# Patient Record
Sex: Female | Born: 1990 | Hispanic: Yes | Marital: Married | Smoking: Never smoker
Health system: Southern US, Community
[De-identification: ages and names within clinical notes are randomized; demographics above are authoritative.]

## PROBLEM LIST (undated history)

## (undated) ENCOUNTER — Inpatient Hospital Stay: Payer: Self-pay

## (undated) DIAGNOSIS — D649 Anemia, unspecified: Secondary | ICD-10-CM

---

## 2016-03-02 ENCOUNTER — Encounter: Payer: Self-pay | Admitting: *Deleted

## 2016-03-02 ENCOUNTER — Emergency Department
Admission: EM | Admit: 2016-03-02 | Discharge: 2016-03-02 | Disposition: A | Discharge status: Home / Self Care | Payer: Medicaid Other | Attending: Emergency Medicine | Admitting: Emergency Medicine

## 2016-03-02 ENCOUNTER — Emergency Department: Payer: Medicaid Other

## 2016-03-02 DIAGNOSIS — B9689 Other specified bacterial agents as the cause of diseases classified elsewhere: Secondary | ICD-10-CM

## 2016-03-02 DIAGNOSIS — O23591 Infection of other part of genital tract in pregnancy, first trimester: Secondary | ICD-10-CM | POA: Insufficient documentation

## 2016-03-02 DIAGNOSIS — O99611 Diseases of the digestive system complicating pregnancy, first trimester: Secondary | ICD-10-CM | POA: Insufficient documentation

## 2016-03-02 DIAGNOSIS — Z3A08 8 weeks gestation of pregnancy: Secondary | ICD-10-CM | POA: Diagnosis not present

## 2016-03-02 DIAGNOSIS — K219 Gastro-esophageal reflux disease without esophagitis: Secondary | ICD-10-CM

## 2016-03-02 DIAGNOSIS — N760 Acute vaginitis: Secondary | ICD-10-CM

## 2016-03-02 DIAGNOSIS — N898 Other specified noninflammatory disorders of vagina: Secondary | ICD-10-CM | POA: Insufficient documentation

## 2016-03-02 DIAGNOSIS — O26891 Other specified pregnancy related conditions, first trimester: Secondary | ICD-10-CM | POA: Diagnosis present

## 2016-03-02 DIAGNOSIS — R1013 Epigastric pain: Secondary | ICD-10-CM

## 2016-03-02 LAB — URINALYSIS, COMPLETE (UACMP) WITH MICROSCOPIC
BILIRUBIN URINE: NEGATIVE
Bacteria, UA: NONE SEEN
GLUCOSE, UA: NEGATIVE mg/dL
Hgb urine dipstick: NEGATIVE
KETONES UR: 80 mg/dL — AB
LEUKOCYTES UA: NEGATIVE
Nitrite: NEGATIVE
PH: 5 (ref 5.0–8.0)
Protein, ur: NEGATIVE mg/dL
RBC / HPF: NONE SEEN RBC/hpf (ref 0–5)
SPECIFIC GRAVITY, URINE: 1.026 (ref 1.005–1.030)

## 2016-03-02 LAB — COMPREHENSIVE METABOLIC PANEL
ALK PHOS: 64 U/L (ref 38–126)
ALT: 25 U/L (ref 14–54)
AST: 20 U/L (ref 15–41)
Albumin: 4.1 g/dL (ref 3.5–5.0)
Anion gap: 8 (ref 5–15)
BILIRUBIN TOTAL: 0.5 mg/dL (ref 0.3–1.2)
BUN: 8 mg/dL (ref 6–20)
CALCIUM: 9.7 mg/dL (ref 8.9–10.3)
CO2: 24 mmol/L (ref 22–32)
CREATININE: 0.54 mg/dL (ref 0.44–1.00)
Chloride: 102 mmol/L (ref 101–111)
GFR calc non Af Amer: >60 mL/min (ref >60)
Glucose, Bld: 84 mg/dL (ref 65–99)
Potassium: 3.4 mmol/L — ABNORMAL LOW (ref 3.5–5.1)
Sodium: 134 mmol/L — ABNORMAL LOW (ref 135–145)
TOTAL PROTEIN: 7.9 g/dL (ref 6.5–8.1)

## 2016-03-02 LAB — HCG, QUANTITATIVE, PREGNANCY: hCG, Beta Chain, Quant, S: 248378 m[IU]/mL — ABNORMAL HIGH (ref <5)

## 2016-03-02 LAB — LIPASE, BLOOD: LIPASE: 40 U/L (ref 11–51)

## 2016-03-02 LAB — CHLAMYDIA/NGC RT PCR (ARMC ONLY)
Chlamydia Tr: NOT DETECTED
N GONORRHOEAE: NOT DETECTED

## 2016-03-02 LAB — CBC
HCT: 40.8 % (ref 35.0–47.0)
Hemoglobin: 13.7 g/dL (ref 12.0–16.0)
MCH: 29.2 pg (ref 26.0–34.0)
MCHC: 33.6 g/dL (ref 32.0–36.0)
MCV: 87 fL (ref 80.0–100.0)
PLATELETS: 349 10*3/uL (ref 150–440)
RBC: 4.69 MIL/uL (ref 3.80–5.20)
RDW: 15 % — AB (ref 11.5–14.5)
WBC: 9.1 10*3/uL (ref 3.6–11.0)

## 2016-03-02 LAB — WET PREP, GENITAL
Sperm: NONE SEEN
TRICH WET PREP: NONE SEEN
YEAST WET PREP: NONE SEEN

## 2016-03-02 MED ORDER — METOCLOPRAMIDE HCL 5 MG/ML IJ SOLN
INTRAMUSCULAR | Status: AC
  Administered 2016-03-02: 10 mg via INTRAVENOUS
  Filled 2016-03-02: qty 2

## 2016-03-02 MED ORDER — ALUM & MAG HYDROXIDE-SIMETH 200-200-20 MG/5ML PO SUSP
30.0000 mL | Freq: Once | ORAL | Status: AC
  Administered 2016-03-02: 30 mL via ORAL
  Filled 2016-03-02: qty 30

## 2016-03-02 MED ORDER — METOCLOPRAMIDE HCL 5 MG/ML IJ SOLN
10.0000 mg | Freq: Once | INTRAMUSCULAR | Status: AC
  Administered 2016-03-02: 10 mg via INTRAVENOUS

## 2016-03-02 MED ORDER — METRONIDAZOLE 500 MG PO TABS: 500.0000 mg | ORAL_TABLET | Freq: Two times a day (BID) | ORAL | 0 refills | Status: AC

## 2016-03-02 MED ORDER — METOCLOPRAMIDE HCL 10 MG PO TABS: 10.0000 mg | ORAL_TABLET | Freq: Three times a day (TID) | ORAL | 1 refills | Status: DC

## 2016-03-02 MED ORDER — ALUM & MAG HYDROXIDE-SIMETH 400-400-40 MG/5ML PO SUSP: 5.0000 mL | Freq: Four times a day (QID) | ORAL | 0 refills | Status: DC | PRN

## 2016-03-02 MED ORDER — FAMOTIDINE IN NACL 20-0.9 MG/50ML-% IV SOLN
INTRAVENOUS | Status: AC
  Administered 2016-03-02: 20 mg via INTRAVENOUS
  Filled 2016-03-02: qty 50

## 2016-03-02 MED ORDER — FAMOTIDINE IN NACL 20-0.9 MG/50ML-% IV SOLN
20.0000 mg | Freq: Once | INTRAVENOUS | Status: AC
  Administered 2016-03-02: 20 mg via INTRAVENOUS

## 2016-03-02 MED ORDER — FAMOTIDINE 20 MG PO TABS: 20.0000 mg | ORAL_TABLET | Freq: Every day | ORAL | 1 refills | Status: DC

## 2016-03-02 NOTE — ED Triage Notes (Signed)
Pt reports she is approx 2 months pregnant.  Pt [facility] for [facility].  Pt has abd pain with vag discharge and foul odor.  Interpreter with pt.

## 2016-03-02 NOTE — ED Provider Notes (Signed)
[facility] Emergency Department Provider Note  ____________________________________________  Time seen: Approximately 8:10 PM  I have reviewed the triage vital signs and the nursing notes.   HISTORY  Chief Complaint Abdominal Pain   HPI Savannah Terry is a 26 y.o. female [phone] [id number] at [redacted] weeks GA per LMP who presents for evaluation of abdominal pain and vaginal discharge. Patient reports one week of constant epigastric burning pain worse post-prandially. Taking TUMS at home with no relief. No prior h/o GERD. No NSAIDS, melena, coffee ground emesis or hematemesis. Patient has had persistent nausea and vomiting with this pregnancy. No fever, no chest pain, no SOB, no lower abdominal pain, no dysuria or hematuria. She also complains of 3 days of white vaginal discharge and foul odor. She has not had a confirmatory US for this pregnancy yet.  No past medical history on file.  There are no active problems to display for this patient.   No past surgical history on file.  Prior to Admission medications   Medication Sig Start Date End Date Taking? Authorizing Provider  alum & mag hydroxide-simeth (MAALOX MAX) 400-400-40 MG/5ML suspension Take 5 mLs by mouth every 6 (six) hours as needed for indigestion. 03/02/16   Nita Sicklearolina Antoinette Haskett, MD  famotidine (PEPCID) 20 MG tablet Take 1 tablet (20 mg total) by mouth daily. 03/02/16 03/02/17  Nita Sicklearolina Masaki Rothbauer, MD  metoCLOPramide (REGLAN) 10 MG tablet Take 1 tablet (10 mg total) by mouth 3 (three) times daily with meals. 03/02/16 03/02/17  Nita Sicklearolina Tyvon Eggenberger, MD  metroNIDAZOLE (FLAGYL) 500 MG tablet Take 1 tablet (500 mg total) by mouth 2 (two) times daily. 03/02/16 03/09/16  Nita Sicklearolina Caterin Tabares, MD    Allergies Patient has no known allergies.  No family history on file.  Social History Social History  Substance Use Topics  . Smoking status: Never Smoker  . Smokeless tobacco: Never Used  . Alcohol use No    Review of  Systems  Constitutional: Negative for fever. Eyes: Negative for visual changes. ENT: Negative for sore throat. Neck: No neck pain  Cardiovascular: Negative for chest pain. Respiratory: Negative for shortness of breath. Gastrointestinal: + epigastric burning abdominal pain, nausea, and vomiting. No diarrhea. Genitourinary: Negative for dysuria. + vaginal discharge Musculoskeletal: Negative for back pain. Skin: Negative for rash. Neurological: Negative for headaches, weakness or numbness. Psych: No SI or HI  ____________________________________________   PHYSICAL EXAM:  VITAL SIGNS: ED Triage Vitals  Enc Vitals Group     BP 03/02/16 1912 (!) 153/93     Pulse --      Resp 03/02/16 1912 20     Temp 03/02/16 1912 98.4 F (36.9 C)     Temp Source 03/02/16 1912 Oral     SpO2 03/02/16 1912 99 %     Weight 03/02/16 1914 130 lb (59 kg)     Height 03/02/16 1914 5\' 3"  (1.6 m)     Head Circumference --      Peak Flow --      Pain Score 03/02/16 1914 7     Pain Loc --      Pain Edu? --      Excl. in GC? --     Constitutional: Alert and oriented. Well appearing and in no apparent distress. HEENT:      Head: Normocephalic and atraumatic.         Eyes: Conjunctivae are normal. Sclera is non-icteric. EOMI. PERRL      Mouth/Throat: Mucous membranes are moist.  Neck: Supple with no signs of meningismus. Cardiovascular: Regular rate and rhythm. No murmurs, gallops, or rubs. 2+ symmetrical distal pulses are present in all extremities. No JVD. Respiratory: Normal respiratory effort. Lungs are clear to auscultation bilaterally. No wheezes, crackles, or rhonchi.  Gastrointestinal: Soft, ttp over the epigastric region, and non distended with positive bowel sounds. No rebound or guarding. Genitourinary: No CVA tenderness. Musculoskeletal: Nontender with normal range of motion in all extremities. No edema, cyanosis, or erythema of extremities. Neurologic: Normal speech and language. Face  is symmetric. Moving all extremities. No gross focal neurologic deficits are appreciated. Skin: Skin is warm, dry and intact. No rash noted. Psychiatric: Mood and affect are normal. Speech and behavior are normal.  ____________________________________________   LABS (all labs ordered are listed, but only abnormal results are displayed)  Labs Reviewed  WET PREP, GENITAL - Abnormal; Notable for the following:       Result Value   Clue Cells Wet Prep HPF POC PRESENT (*)    WBC, Wet Prep HPF POC FEW (*)    All other components within normal limits  COMPREHENSIVE METABOLIC PANEL - Abnormal; Notable for the following:    Sodium 134 (*)    Potassium 3.4 (*)    All other components within normal limits  CBC - Abnormal; Notable for the following:    RDW 15.0 (*)    All other components within normal limits  URINALYSIS, COMPLETE (UACMP) WITH MICROSCOPIC - Abnormal; Notable for the following:    Color, Urine YELLOW (*)    APPearance HAZY (*)    Ketones, ur 80 (*)    Squamous Epithelial / LPF 6-30 (*)    All other components within normal limits  HCG, QUANTITATIVE, PREGNANCY - Abnormal; Notable for the following:    hCG, Beta Chain, Quant, [place] 161,096 (*)    All other components within normal limits  CHLAMYDIA/NGC RT PCR ([facility] ONLY)  LIPASE, BLOOD   ____________________________________________  EKG  ED ECG REPORT I, Nita Sickle, the attending physician, personally viewed and interpreted this ECG.  Normal sinus rhythm, rate of 81, normal intervals, normal axis, no ST elevations or depressions. Normal EKG.  ____________________________________________  RADIOLOGY  TVUS: Single intrauterine pregnancy. Estimated gestational age by crown-rump length is 8 weeks 3 days. No acute complication demonstrated sonographically.  RUQ [place]: Cholelithiasis without sonographic findings of acute cholecystitis ____________________________________________   PROCEDURES  Procedure(s)  performed: None Procedures Critical Care performed:  None ____________________________________________   INITIAL IMPRESSION / ASSESSMENT AND PLAN / ED COURSE  26 y.o. female [phone] at [redacted] weeks GA per LMP who presents for evaluation of 1 weeks of epigastric burning abdominal pain worse after eating and few days of foul odor vaginal discharge. Patient has no RUQ ttp and normal LFT, Tbili, and lipase making GB pathology less likely but will perform RUQ [place] as she is tender over her epigastrium region. Description of pain consistent with GERD vs gastritis. No coffee ground emesis or melena concerning for bleeding PUD. Will give maalox, IV pepcid, and IV reglan. Patient has no lower abdominal ttp however has not had a formal TVUS, will perform one. Will also do pelvic and get wet prep/gc/chlamydia.     Care transferred to Dr. Huel Cote at the end of my shift pending RUQ [place] and TVUS results.Patient remains stable.  Pertinent labs & imaging results that were available during my care of the patient were reviewed by me and considered in my medical decision making (see chart for details).  ____________________________________________   FINAL CLINICAL IMPRESSION(S) / ED DIAGNOSES  Final diagnoses:  Epigastric abdominal pain  Gastroesophageal reflux disease without esophagitis  BV (bacterial vaginosis)      NEW MEDICATIONS STARTED DURING THIS VISIT:  Discharge Medication List as of 03/02/2016 10:53 PM    START taking these medications   Details  alum & mag hydroxide-simeth (MAALOX MAX) 400-400-40 MG/5ML suspension Take 5 mLs by mouth every 6 (six) hours as needed for indigestion., Starting Wed 03/02/2016, Print    famotidine (PEPCID) 20 MG tablet Take 1 tablet (20 mg total) by mouth daily., Starting Wed 03/02/2016, Until Thu 03/02/2017, Print    metoCLOPramide (REGLAN) 10 MG tablet Take 1 tablet (10 mg total) by mouth 3 (three) times daily with meals., Starting Wed 03/02/2016, Until Thu  03/02/2017, Print    metroNIDAZOLE (FLAGYL) 500 MG tablet Take 1 tablet (500 mg total) by mouth 2 (two) times daily., Starting Wed 03/02/2016, Until Wed 03/09/2016, Print         Note:  This document was prepared using Dragon voice recognition software and may include unintentional dictation errors.    Nita Sickle, MD 03/05/16 [phone]

## 2016-03-02 NOTE — ED Notes (Signed)
Pt returned from US

## 2016-04-02 ENCOUNTER — Emergency Department: Payer: Medicaid Other

## 2016-04-02 ENCOUNTER — Emergency Department
Admission: EM | Admit: 2016-04-02 | Discharge: 2016-04-02 | Disposition: A | Discharge status: Home / Self Care | Payer: Medicaid Other | Attending: Emergency Medicine | Admitting: Emergency Medicine

## 2016-04-02 ENCOUNTER — Encounter: Payer: Self-pay | Admitting: Emergency Medicine

## 2016-04-02 DIAGNOSIS — O0281 Inappropriate change in quantitative human chorionic gonadotropin (hCG) in early pregnancy: Secondary | ICD-10-CM | POA: Diagnosis not present

## 2016-04-02 DIAGNOSIS — O2341 Unspecified infection of urinary tract in pregnancy, first trimester: Secondary | ICD-10-CM | POA: Insufficient documentation

## 2016-04-02 DIAGNOSIS — O210 Mild hyperemesis gravidarum: Secondary | ICD-10-CM | POA: Insufficient documentation

## 2016-04-02 DIAGNOSIS — Z79899 Other long term (current) drug therapy: Secondary | ICD-10-CM | POA: Diagnosis not present

## 2016-04-02 DIAGNOSIS — Z3A13 13 weeks gestation of pregnancy: Secondary | ICD-10-CM | POA: Diagnosis not present

## 2016-04-02 DIAGNOSIS — O219 Vomiting of pregnancy, unspecified: Secondary | ICD-10-CM | POA: Diagnosis present

## 2016-04-02 LAB — URINALYSIS, COMPLETE (UACMP) WITH MICROSCOPIC
BACTERIA UA: NONE SEEN
Bilirubin Urine: NEGATIVE
Glucose, UA: NEGATIVE mg/dL
Hgb urine dipstick: NEGATIVE
Ketones, ur: 80 mg/dL — AB
Nitrite: NEGATIVE
PROTEIN: 100 mg/dL — AB
SPECIFIC GRAVITY, URINE: 1.026 (ref 1.005–1.030)
pH: 6 (ref 5.0–8.0)

## 2016-04-02 LAB — COMPREHENSIVE METABOLIC PANEL
ALK PHOS: 51 U/L (ref 38–126)
ALT: 69 U/L — ABNORMAL HIGH (ref 14–54)
ANION GAP: 11 (ref 5–15)
AST: 36 U/L (ref 15–41)
Albumin: 3.9 g/dL (ref 3.5–5.0)
BILIRUBIN TOTAL: 0.7 mg/dL (ref 0.3–1.2)
BUN: 7 mg/dL (ref 6–20)
CALCIUM: 9.6 mg/dL (ref 8.9–10.3)
CO2: 20 mmol/L — ABNORMAL LOW (ref 22–32)
CREATININE: 0.47 mg/dL (ref 0.44–1.00)
Chloride: 101 mmol/L (ref 101–111)
GFR calc non Af Amer: >60 mL/min (ref >60)
Glucose, Bld: 84 mg/dL (ref 65–99)
Potassium: 2.7 mmol/L — CL (ref 3.5–5.1)
Sodium: 132 mmol/L — ABNORMAL LOW (ref 135–145)
TOTAL PROTEIN: 7.9 g/dL (ref 6.5–8.1)

## 2016-04-02 LAB — HCG, QUANTITATIVE, PREGNANCY: HCG, BETA CHAIN, QUANT, S: 180592 m[IU]/mL — AB (ref <5)

## 2016-04-02 LAB — CBC
HEMATOCRIT: 42.4 % (ref 35.0–47.0)
HEMOGLOBIN: 14.9 g/dL (ref 12.0–16.0)
MCH: 29.9 pg (ref 26.0–34.0)
MCHC: 35.1 g/dL (ref 32.0–36.0)
MCV: 85.2 fL (ref 80.0–100.0)
Platelets: 375 10*3/uL (ref 150–440)
RBC: 4.98 MIL/uL (ref 3.80–5.20)
RDW: 14.5 % (ref 11.5–14.5)
WBC: 8.8 10*3/uL (ref 3.6–11.0)

## 2016-04-02 LAB — LIPASE, BLOOD: Lipase: 19 U/L (ref 11–51)

## 2016-04-02 MED ORDER — SODIUM CHLORIDE 0.9 % IV SOLN
Freq: Once | INTRAVENOUS | Status: AC
Start: 2016-04-02 — End: 2016-04-02
  Administered 2016-04-02: 14:00:00 via INTRAVENOUS

## 2016-04-02 MED ORDER — POTASSIUM CHLORIDE CRYS ER 20 MEQ PO TBCR
60.0000 meq | EXTENDED_RELEASE_TABLET | Freq: Once | ORAL | Status: AC
  Administered 2016-04-02: 60 meq via ORAL
  Filled 2016-04-02: qty 3

## 2016-04-02 MED ORDER — ACETAMINOPHEN 500 MG PO TABS
1000.0000 mg | ORAL_TABLET | Freq: Once | ORAL | Status: AC
  Administered 2016-04-02: 1000 mg via ORAL
  Filled 2016-04-02: qty 2

## 2016-04-02 MED ORDER — ONDANSETRON HCL 4 MG/2ML IJ SOLN
4.0000 mg | Freq: Once | INTRAMUSCULAR | Status: AC
  Administered 2016-04-02: 4 mg via INTRAVENOUS
  Filled 2016-04-02: qty 2

## 2016-04-02 MED ORDER — NITROFURANTOIN MONOHYD MACRO 100 MG PO CAPS: 100.0000 mg | ORAL_CAPSULE | Freq: Two times a day (BID) | ORAL | 0 refills | Status: DC

## 2016-04-02 MED ORDER — ONDANSETRON 4 MG PO TBDP: 4.0000 mg | ORAL_TABLET | Freq: Three times a day (TID) | ORAL | 1 refills | Status: DC | PRN

## 2016-04-02 NOTE — ED Triage Notes (Addendum)
Pt to ed with c/o abd pain x 4 days. Also reports vomiting after eating.  Pt is approx 3 months pregnant. Was seen here in feb for vomiting.

## 2016-04-02 NOTE — ED Notes (Signed)
Pt able to tolerate other half tablet of potassium and tylenol. Offered more nausea medication, pt declines at this time.

## 2016-04-02 NOTE — ED Notes (Signed)
Pt resting comfortably at this time, decline medication for nausea.

## 2016-04-02 NOTE — ED Provider Notes (Signed)
[facility] Emergency Department Provider Note        Time seen: ----------------------------------------- 1:55 PM on 04/02/2016 -----------------------------------------    I have reviewed the triage vital signs and the nursing notes.   HISTORY  Chief Complaint Abdominal Pain    HPI Savannah Terry is a 26 y.o. female who presents to ER for abdominal pain for the last 4 days. Patient reports vomiting after eating. She is approximately 3 months pregnant, was seen here in February for vomiting.Patient denies any vaginal bleeding or discharge. She reports taking Reglan as an outpatient without any improvement.   History reviewed. No pertinent past medical history.  There are no active problems to display for this patient.   History reviewed. No pertinent surgical history.  Allergies Patient has no known allergies.  Social History Social History  Substance Use Topics  . Smoking status: Never Smoker  . Smokeless tobacco: Never Used  . Alcohol use No    Review of Systems Constitutional: Negative for fever. Cardiovascular: Negative for chest pain. Respiratory: Negative for shortness of breath. Gastrointestinal: Positive for abdominal pain, vomiting Genitourinary: Negative for dysuria. negative for vaginal bleeding Musculoskeletal: Negative for back pain. Skin: Negative for rash. Neurological: Negative for headaches, focal weakness or numbness.  10-point ROS otherwise negative.  ____________________________________________   PHYSICAL EXAM:  VITAL SIGNS: ED Triage Vitals  Enc Vitals Group     BP 04/02/16 1300 (!) 121/93     Pulse Rate 04/02/16 1300 (!) 123     Resp 04/02/16 1300 18     Temp 04/02/16 1300 98.3 F (36.8 C)     Temp Source 04/02/16 1300 Oral     SpO2 04/02/16 1300 96 %     Weight 04/02/16 1300 130 lb (59 kg)     Height --      Head Circumference --      Peak Flow --      Pain Score 04/02/16 1301 8   Pain Loc --      Pain Edu? --      Excl. in GC? --     Constitutional: Alert and oriented. Well appearing and in no distress. Eyes: Conjunctivae are normal. PERRL. Normal extraocular movements. ENT   Head: Normocephalic and atraumatic.   Nose: No congestion/rhinnorhea.   Mouth/Throat: Mucous membranes are moist.   Neck: No stridor. Cardiovascular: Normal rate, regular rhythm. No murmurs, rubs, or gallops. Respiratory: Normal respiratory effort without tachypnea nor retractions. Breath sounds are clear and equal bilaterally. No wheezes/rales/rhonchi. Gastrointestinal: Soft with nonfocal tenderness, normal bowel sounds. Musculoskeletal: Nontender with normal range of motion in all extremities. No lower extremity tenderness nor edema. Neurologic:  Normal speech and language. No gross focal neurologic deficits are appreciated.  Skin:  Skin is warm, dry and intact. No rash noted. Psychiatric: Mood and affect are normal. Speech and behavior are normal.  ____________________________________________  ED COURSE:  Pertinent labs & imaging results that were available during my care of the patient were reviewed by me and considered in my medical decision making (see chart for details). Patient presents to ER with abdominal pain and persistent vomiting. We will assess with labs, give IV fluids and antiemetics.   Procedures ____________________________________________   LABS (pertinent positives/negatives)  Labs Reviewed  COMPREHENSIVE METABOLIC PANEL - Abnormal; Notable for the following:       Result Value   Sodium 132 (*)    Potassium 2.7 (*)    CO2 20 (*)    ALT 69 (*)  All other components within normal limits  URINALYSIS, COMPLETE (UACMP) WITH MICROSCOPIC - Abnormal; Notable for the following:    Color, Urine AMBER (*)    APPearance HAZY (*)    Ketones, ur 80 (*)    Protein, ur 100 (*)    Leukocytes, UA TRACE (*)    Squamous Epithelial / LPF 6-30 (*)    All other  components within normal limits  HCG, QUANTITATIVE, PREGNANCY - Abnormal; Notable for the following:    hCG, Beta Chain, Quant, S 180,592 (*)    All other components within normal limits  URINE CULTURE  LIPASE, BLOOD  CBC    RADIOLOGY Images were viewed by me  Pregnancy ultrasound  IMPRESSION: Single live IUP with estimated gestational age [redacted] weeks 0 days. ____________________________________________  FINAL ASSESSMENT AND PLAN  Hyperemesis, UTI  Plan: Patient with labs and imaging as dictated above. Patient presented to the ER for abdominal pain and vomiting in pregnancy. Ultrasound findings were reassuring, she's calmly feeling better. I will discharge her with Zofran prescriptions and Macrobid. She is stable for outpatient follow-up with her OB/GYN doctor.   Emily Filbert, MD   Note: This note was generated in part or whole with voice recognition software. Voice recognition is usually quite accurate but there are transcription errors that can and very often do occur. I apologize for any typographical errors that were not detected and corrected.     Emily Filbert, MD 04/02/16 [phone]

## 2016-04-02 NOTE — ED Notes (Signed)
Pt only able to tolerate 2.35 potassium tablets. Pt dry heaving.

## 2016-04-04 LAB — URINE CULTURE

## 2016-05-17 LAB — OB RESULTS CONSOLE GC/CHLAMYDIA
Chlamydia: NEGATIVE
Gonorrhea: NEGATIVE

## 2016-05-17 LAB — OB RESULTS CONSOLE RPR: RPR: NONREACTIVE

## 2016-05-17 LAB — OB RESULTS CONSOLE HEPATITIS B SURFACE ANTIGEN: Hepatitis B Surface Ag: NEGATIVE

## 2016-05-17 LAB — OB RESULTS CONSOLE ABO/RH

## 2016-05-17 LAB — OB RESULTS CONSOLE VARICELLA ZOSTER ANTIBODY, IGG: Varicella: IMMUNE

## 2016-05-17 LAB — OB RESULTS CONSOLE HIV ANTIBODY (ROUTINE TESTING): HIV: NONREACTIVE

## 2016-05-17 LAB — OB RESULTS CONSOLE RUBELLA ANTIBODY, IGM: RUBELLA: IMMUNE

## 2016-06-10 ENCOUNTER — Observation Stay
Admission: EM | Admit: 2016-06-10 | Discharge: 2016-06-10 | Disposition: A | Discharge status: Home / Self Care | Payer: Medicaid Other | Attending: Obstetrics and Gynecology | Admitting: Obstetrics and Gynecology

## 2016-06-10 DIAGNOSIS — M545 Low back pain, unspecified: Secondary | ICD-10-CM | POA: Diagnosis present

## 2016-06-10 DIAGNOSIS — Z3A22 22 weeks gestation of pregnancy: Secondary | ICD-10-CM | POA: Diagnosis not present

## 2016-06-10 DIAGNOSIS — O26892 Other specified pregnancy related conditions, second trimester: Secondary | ICD-10-CM | POA: Diagnosis not present

## 2016-06-10 DIAGNOSIS — O26893 Other specified pregnancy related conditions, third trimester: Secondary | ICD-10-CM | POA: Diagnosis not present

## 2016-06-10 LAB — URINALYSIS, COMPLETE (UACMP) WITH MICROSCOPIC
Bilirubin Urine: NEGATIVE
Glucose, UA: NEGATIVE mg/dL
Hgb urine dipstick: NEGATIVE
KETONES UR: NEGATIVE mg/dL
Leukocytes, UA: NEGATIVE
Nitrite: NEGATIVE
PH: 7 (ref 5.0–8.0)
Protein, ur: NEGATIVE mg/dL
RBC / HPF: NONE SEEN RBC/hpf (ref 0–5)
Specific Gravity, Urine: 1.005 (ref 1.005–1.030)
WBC, UA: NONE SEEN WBC/hpf (ref 0–5)

## 2016-06-10 MED ORDER — ACETAMINOPHEN 325 MG PO TABS: 650.0000 mg | ORAL_TABLET | ORAL | Status: DC | PRN

## 2016-06-10 NOTE — OB Triage Note (Addendum)
Patient arrived in triage with c/o abdominal and back pain that started about 45 mins ago.  Pain was constant initially, but is now intermittent "pressure" in upper abdomen. Patient states it does not feel like contractions. Denies leaking of fluid or vaginal bleeding. States positive fetal movement. Abdomen palpates soft, tender to touch in upper abdomen. EFM explained and applied.

## 2016-06-10 NOTE — Final Progress Note (Signed)
Physician Final Progress Note  Patient ID: Savannah Terry MRN: [id number] DOB/AGE: 26/05/1990 25 y.o.  Admit date: 06/10/2016 Admitting provider: Vena AustriaAndreas Jazell Rosenau, MD Discharge date: 06/10/2016   Admission Diagnoses: Lumbago  Discharge Diagnoses:  Active Problems:   Lumbago  26 yo [id number] at 6428w5d presenting with lumbago, improved after admission, no contraction on tocometer, negative UA.  +FM, no LOF, no VB  Consults: None  Significant Findings/ Diagnostic Studies:  Results for orders placed or performed during the hospital encounter of 06/10/16 (from the past 24 hour(s))  Urinalysis, Complete w Microscopic     Status: Abnormal   Collection Time: 06/10/16  3:52 AM  Result Value Ref Range   Color, Urine STRAW (A) YELLOW   APPearance CLEAR (A) CLEAR   Specific Gravity, Urine 1.005 1.005 - 1.030   pH 7.0 5.0 - 8.0   Glucose, UA NEGATIVE NEGATIVE mg/dL   Hgb urine dipstick NEGATIVE NEGATIVE   Bilirubin Urine NEGATIVE NEGATIVE   Ketones, ur NEGATIVE NEGATIVE mg/dL   Protein, ur NEGATIVE NEGATIVE mg/dL   Nitrite NEGATIVE NEGATIVE   Leukocytes, UA NEGATIVE NEGATIVE   RBC / HPF NONE SEEN 0 - 5 RBC/hpf   WBC, UA NONE SEEN 0 - 5 WBC/hpf   Bacteria, UA RARE (A) NONE SEEN   Squamous Epithelial / LPF 0-5 (A) NONE SEEN   Mucous PRESENT      Procedures: +FHT on admission  Discharge Condition: good  Disposition: 01-Home or Self Care  Diet: Regular diet  Discharge Activity: Activity as tolerated  Discharge Instructions    Discharge activity:  No Restrictions    Complete by:  As directed    Discharge diet:  No restrictions    Complete by:  As directed    No sexual activity restrictions    Complete by:  As directed    Notify physician for a general feeling that "something is not right"    Complete by:  As directed    Notify physician for increase or change in vaginal discharge    Complete by:  As directed    Notify physician for intestinal cramps, with or without  diarrhea, sometimes described as "gas pain"    Complete by:  As directed    Notify physician for leaking of fluid    Complete by:  As directed    Notify physician for low, dull backache, unrelieved by heat or Tylenol    Complete by:  As directed    Notify physician for menstrual like cramps    Complete by:  As directed    Notify physician for pelvic pressure    Complete by:  As directed    Notify physician for uterine contractions.  These may be painless and feel like the uterus is tightening or the baby is  "balling up"    Complete by:  As directed    Notify physician for vaginal bleeding    Complete by:  As directed    PRETERM LABOR:  Includes any of the follwing symptoms that occur between 20 - [redacted] weeks gestation.  If these symptoms are not stopped, preterm labor can result in preterm delivery, placing your baby at risk    Complete by:  As directed      Allergies as of 06/10/2016   No Known Allergies     Medication List    TAKE these medications   alum & mag hydroxide-simeth 400-400-40 MG/5ML suspension Commonly known as:  MAALOX MAX Take 5 mLs by mouth every 6 (six)  hours as needed for indigestion.   doxylamine (Sleep) 25 MG tablet Commonly known as:  UNISOM Take 25 mg by mouth 2 (two) times daily.   famotidine 20 MG tablet Commonly known as:  PEPCID Take 1 tablet (20 mg total) by mouth daily.   metoCLOPramide 10 MG tablet Commonly known as:  REGLAN Take 1 tablet (10 mg total) by mouth 3 (three) times daily with meals.   nitrofurantoin (macrocrystal-monohydrate) 100 MG capsule Commonly known as:  MACROBID Take 1 capsule (100 mg total) by mouth 2 (two) times daily.   ondansetron 4 MG disintegrating tablet Commonly known as:  ZOFRAN ODT Take 1 tablet (4 mg total) by mouth every 8 (eight) hours as needed for nausea or vomiting.   PRENATAL PO Take 1 tablet by mouth daily.        Total time spent taking care of this patient: 15 minutes  Signed: Vena Austria 06/10/2016, 5:02 AM

## 2016-06-10 NOTE — OB Triage Note (Signed)
Patient and spouse given discharge instructions including preterm labor precautions, fetal kick count instructions, and follow up information- verbalized understanding.  All questions answered. Patient discharged in stable condition accompanied by husband.

## 2016-06-10 NOTE — Discharge Summary (Signed)
See final progress note. 

## 2016-09-26 ENCOUNTER — Encounter: Payer: Self-pay | Admitting: *Deleted

## 2016-09-26 ENCOUNTER — Observation Stay
Admission: EM | Admit: 2016-09-26 | Discharge: 2016-09-26 | Disposition: A | Discharge status: Home / Self Care | Payer: Medicaid Other | Attending: Obstetrics and Gynecology | Admitting: Obstetrics and Gynecology

## 2016-09-26 DIAGNOSIS — O368130 Decreased fetal movements, third trimester, not applicable or unspecified: Secondary | ICD-10-CM | POA: Diagnosis present

## 2016-09-26 DIAGNOSIS — Z3A38 38 weeks gestation of pregnancy: Secondary | ICD-10-CM | POA: Insufficient documentation

## 2016-09-26 DIAGNOSIS — Z79899 Other long term (current) drug therapy: Secondary | ICD-10-CM | POA: Diagnosis not present

## 2016-09-26 DIAGNOSIS — O4290 Premature rupture of membranes, unspecified as to length of time between rupture and onset of labor, unspecified weeks of gestation: Secondary | ICD-10-CM | POA: Insufficient documentation

## 2016-09-26 HISTORY — DX: Anemia, unspecified: D64.9

## 2016-09-26 LAB — URINALYSIS, ROUTINE W REFLEX MICROSCOPIC
BACTERIA UA: NONE SEEN
BILIRUBIN URINE: NEGATIVE
Glucose, UA: NEGATIVE mg/dL
Ketones, ur: NEGATIVE mg/dL
Nitrite: NEGATIVE
PROTEIN: NEGATIVE mg/dL
SPECIFIC GRAVITY, URINE: 1.012 (ref 1.005–1.030)
pH: 7 (ref 5.0–8.0)

## 2016-09-26 NOTE — OB Triage Note (Signed)
C/O back and abdominal pain since 1800 last night. C/O leaking fluid the entire pregnancy as she did with her other two children. Nitrazine negative. Cervix closed upon vaginal exam. C/O decreased fetal movement "last night". Fetal movement audible while pt. Is on monitor. Elaina HoopsElks, Alinda Egolf S

## 2016-09-26 NOTE — Discharge Summary (Addendum)
Schermerhorn, Ihor Austin, MD  Obstetrics    Hide copied text Hover for attribution information Patient ID: Savannah Terry, female   DOB: 1990-11-07, 26 y.o.   MRN: [id number]  Savannah Terry is a 26 y.o. female. She is at [redacted]w[redacted]d gestation. Patient's last menstrual period was 01/03/2016 (exact date). Estimated Date of Delivery: 10/09/16  Prenatal care site:  [facility]  Chief complaint: abd tightening , lbp , decreased fetal movt( 1 week)  , bladder pressure also leakage of fluid for the past 30 days .  [facility] and she scheduled for a repeat [facility] at [facility] on 10/04/16.  Denies fever , vag bleeding .  States that she had low fluid in both prior pregnancies      Maternal Medical History:       Past Medical History:  Diagnosis Date  . Anemia   cervical dysplasia  H/o PTL in prior preg .       Past Surgical History:  Procedure Laterality Date  . CESAREAN SECTION     x2    No Known Allergies         Prior to Admission medications   Medication Sig Start Date End Date Taking? Authorizing Provider  Prenatal Vit-Fe Fumarate-FA (PRENATAL PO) Take 1 tablet by mouth daily.   Yes [provider]  alum & mag hydroxide-simeth (MAALOX MAX) 400-400-40 MG/5ML suspension Take 5 mLs by mouth every 6 (six) hours as needed for indigestion. Patient not taking: Reported on 09/26/2016 03/02/16   Nita Sickle, MD  doxylamine, Sleep, (UNISOM) 25 MG tablet Take 25 mg by mouth 2 (two) times daily.    [provider]  famotidine (PEPCID) 20 MG tablet Take 1 tablet (20 mg total) by mouth daily. Patient not taking: Reported on 09/26/2016 03/02/16 03/02/17  Nita Sickle, MD  metoCLOPramide (REGLAN) 10 MG tablet Take 1 tablet (10 mg total) by mouth 3 (three) times daily with meals. Patient not taking: Reported on 09/26/2016 03/02/16 03/02/17  Nita Sickle, MD  nitrofurantoin, macrocrystal-monohydrate, (MACROBID) 100  MG capsule Take 1 capsule (100 mg total) by mouth 2 (two) times daily. Patient not taking: Reported on 09/26/2016 04/02/16   Emily Filbert, MD  ondansetron (ZOFRAN ODT) 4 MG disintegrating tablet Take 1 tablet (4 mg total) by mouth every 8 (eight) hours as needed for nausea or vomiting. Patient not taking: Reported on 09/26/2016 04/02/16   Emily Filbert, MD     Social History: She  reports that she has never smoked. She has never used smokeless tobacco. She reports that she does not drink alcohol or use drugs.  Family History:.no history of gyn cancers  Review of Systems: A full review of systems was performed and negative except as noted in the HPI.   General : no weight loss Eyes : no vision change  Ears : no loss of hearing  Endocrine : no thyroid d/o  Pulmonary : no asthma, no sob CV : no chest pain ,no htn GI: no hepatitis, no Constipation  GU: no recent UTI . No STD M/S: no muscle weakness  O:  BP 127/81   Pulse 100   Temp 98.8 F (37.1 C) (Oral)   Resp 18   Ht  (1.575 m)   Wt 71.2 kg (157 lb)   LMP 01/03/2016 (Exact Date)   BMI 28.72 kg/m  No results found for this or any previous visit (from the past 48 hour(s)).   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 464w1d here for antenatal surveillance forLOF  And decreased fetal movt  No evidence of PTL , LOF   Reassuring fetal  monitoring by NST   Adequate AFI by u/s today   Fetal Wellbeing: Reassuring Cat 1 tracing.  Reactive NST   D/c home stable, precautions reviewed, follow-up as scheduled.  Instructions given for fetal kick counts ----- Ihor Austinhomas J Schermerhorn MD  Attending Obstetrician and Gynecologist [facility], Department of OB/GYN [facility]     Electronically signed by Schermerhorn, Ihor Austinhomas J, MD at 09/26/2016 3:01 PM      ED to Hosp-Admission (Current) on 09/26/2016

## 2016-09-26 NOTE — Progress Notes (Addendum)
Patient ID: Tai Syfert, female   DOB: Nov 08, 1990, 26 y.o.   MRN: [id number]  Adelise Buswell Tienna Bienkowski is a 25 y.o. female. She is at [redacted]w[redacted]d gestation. Patient's last menstrual period was 01/03/2016 (exact date). Estimated Date of Delivery: 10/09/16  Prenatal care site:  [facility]  Chief complaint: abd tightening , lbp , decreased fetal movt( 1 week)  , bladder pressure also leakage of fluid for the past 30 days .  [facility] following and she scheduled for a repeat LTCS at [facility] on 10/04/16.  Denies fever , vag bleeding .  States that she had low fluid in both prior pregnancies      Maternal Medical History:   Past Medical History:  Diagnosis Date  . Anemia   cervical dysplasia  H/o PTL in prior preg .  Past Surgical History:  Procedure Laterality Date  . CESAREAN SECTION     x2    No Known Allergies  Prior to Admission medications   Medication Sig Start Date End Date Taking? Authorizing Provider  Prenatal Vit-Fe Fumarate-FA (PRENATAL PO) Take 1 tablet by mouth daily.   Yes [provider]  alum & mag hydroxide-simeth (MAALOX MAX) 400-400-40 MG/5ML suspension Take 5 mLs by mouth every 6 (six) hours as needed for indigestion. Patient not taking: Reported on 09/26/2016 03/02/16   Nita Sickle, MD  doxylamine, Sleep, (UNISOM) 25 MG tablet Take 25 mg by mouth 2 (two) times daily.    [provider]  famotidine (PEPCID) 20 MG tablet Take 1 tablet (20 mg total) by mouth daily. Patient not taking: Reported on 09/26/2016 03/02/16 03/02/17  Nita Sickle, MD  metoCLOPramide (REGLAN) 10 MG tablet Take 1 tablet (10 mg total) by mouth 3 (three) times daily with meals. Patient not taking: Reported on 09/26/2016 03/02/16 03/02/17  Nita Sickle, MD  nitrofurantoin, macrocrystal-monohydrate, (MACROBID) 100 MG capsule Take 1 capsule (100 mg total) by mouth 2 (two) times daily. Patient not taking: Reported on 09/26/2016 04/02/16   Emily Filbert, MD   ondansetron (ZOFRAN ODT) 4 MG disintegrating tablet Take 1 tablet (4 mg total) by mouth every 8 (eight) hours as needed for nausea or vomiting. Patient not taking: Reported on 09/26/2016 04/02/16   Emily Filbert, MD     Social History: She  reports that she has never smoked. She has never used smokeless tobacco. She reports that she does not drink alcohol or use drugs.  Family History:.no history of gyn cancers  Review of Systems: A full review of systems was performed and negative except as noted in the HPI.   General : no weight loss Eyes : no vision change  Ears : no loss of hearing  Endocrine : no thyroid d/o  Pulmonary : no asthma, no sob CV : no chest pain ,no htn GI: no hepatitis, no Constipation  GU: no recent UTI . No STD M/S: no muscle weakness  O:  BP 127/81   Pulse 100   Temp 98.8 F (37.1 C) (Oral)   Resp 18   Ht  (1.575 m)   Wt 71.2 kg (157 lb)   LMP 01/03/2016 (Exact Date)   BMI 28.72 kg/m  No results found for this or any previous visit (from the past 48 hour(s)).   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 424w1d here for antenatal surveillance forLOF  And decreased fetal movt  No evidence of PTL , LOF   Reassuring fetal monitoring by NST   Adequate AFI by u/s today   Fetal Wellbeing: Reassuring Cat 1 tracing.  Reactive NST   D/c home stable, precautions reviewed, follow-up as scheduled.  Instructions given for fetal kick counts ----- Ihor Austinhomas J Rhyan Radler MD   Attending Obstetrician and Gynecologist [facility], Department of OB/GYN [facility]

## 2017-01-01 ENCOUNTER — Encounter (HOSPITAL_COMMUNITY): Payer: Self-pay

## 2017-12-19 IMAGING — US US ABDOMEN LIMITED
1 series · 14 of 25 positions shown · non-contrast
Comparison: None.

CLINICAL DATA: Abdominal pain for 2 weeks. First trimester
pregnancy.

EXAM:
US ABDOMEN LIMITED - RIGHT UPPER QUADRANT

[Series 1: us abdomen limited · 0.17mm/px · 14 of 72 slices shown]
[im 1/72]
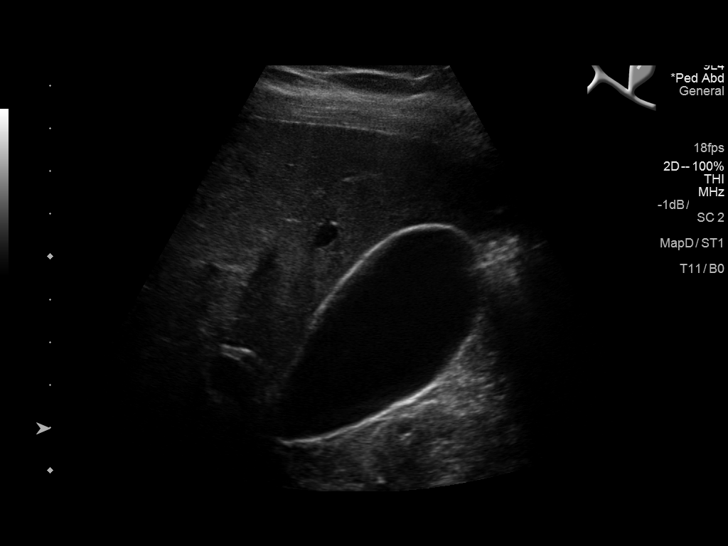
[im 6/72]
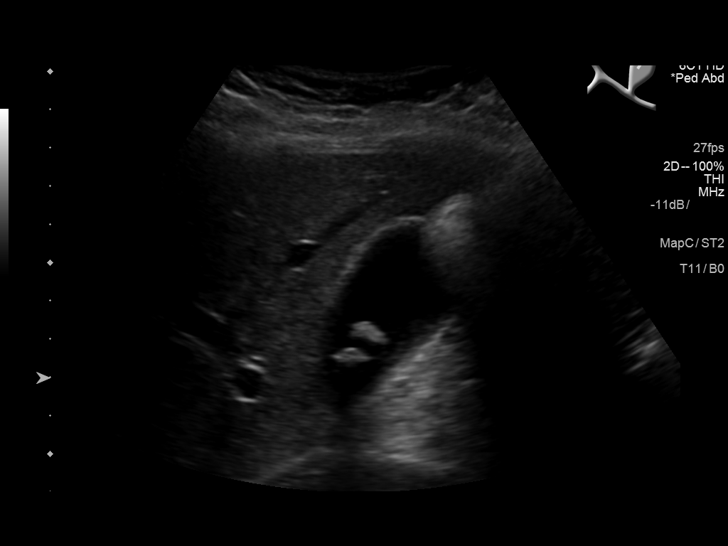
[im 12/72]
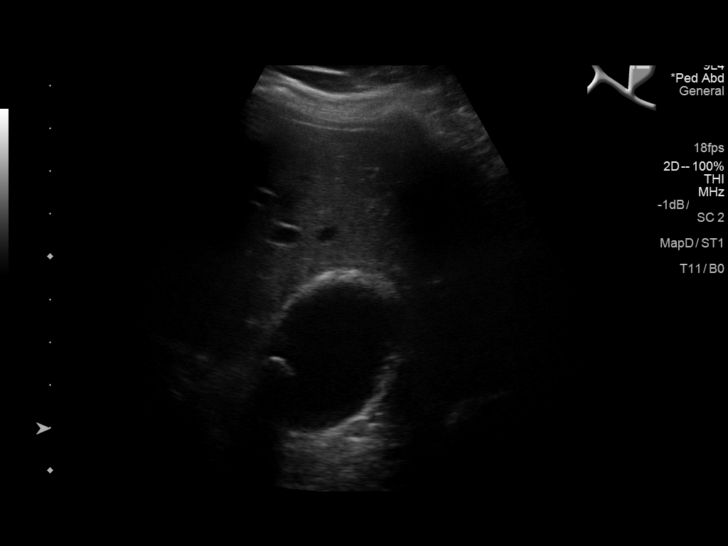
[im 18/72]
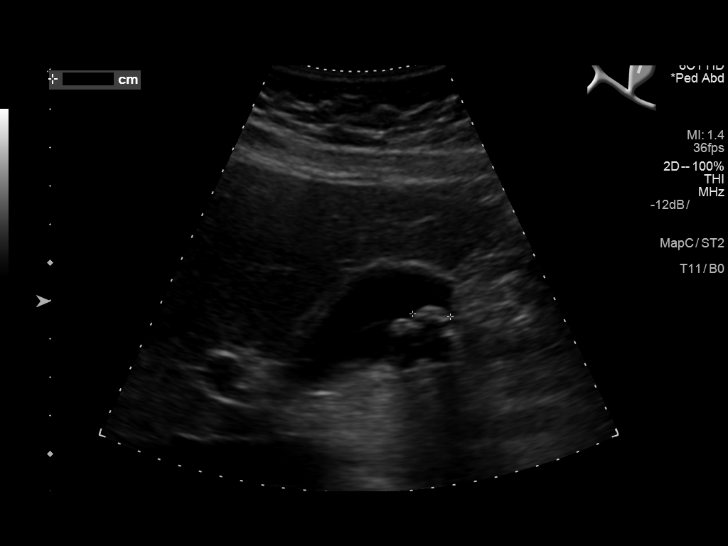
[im 24/72]
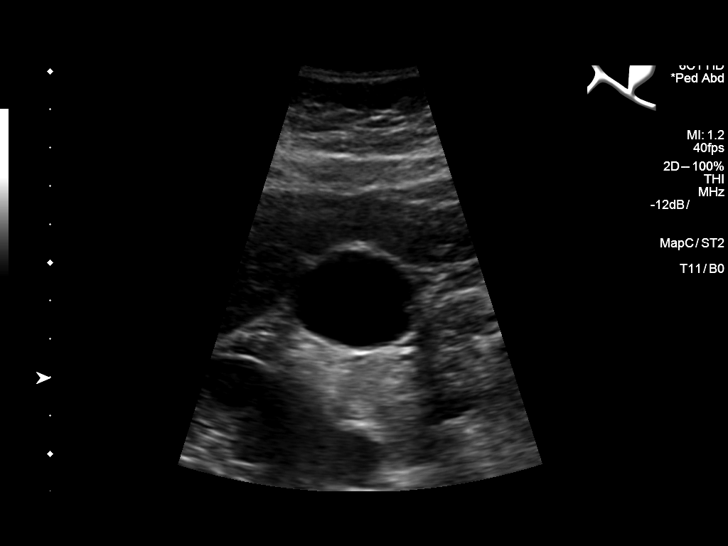
[im 27/72]
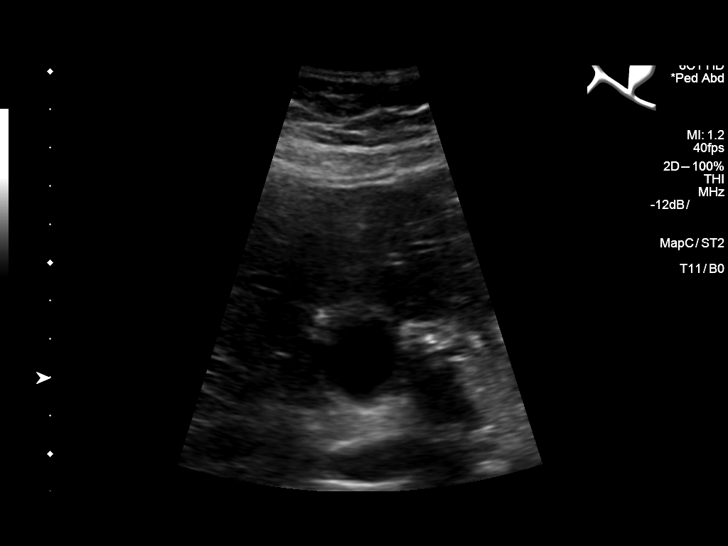
[im 33/72]
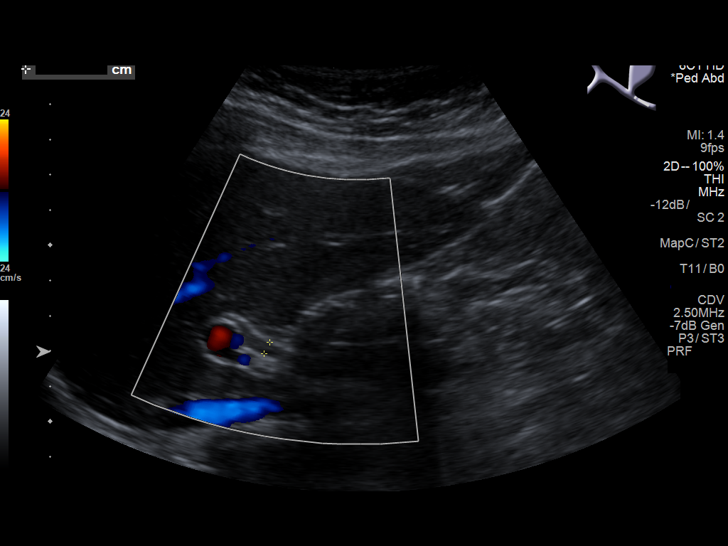
[im 39/72]
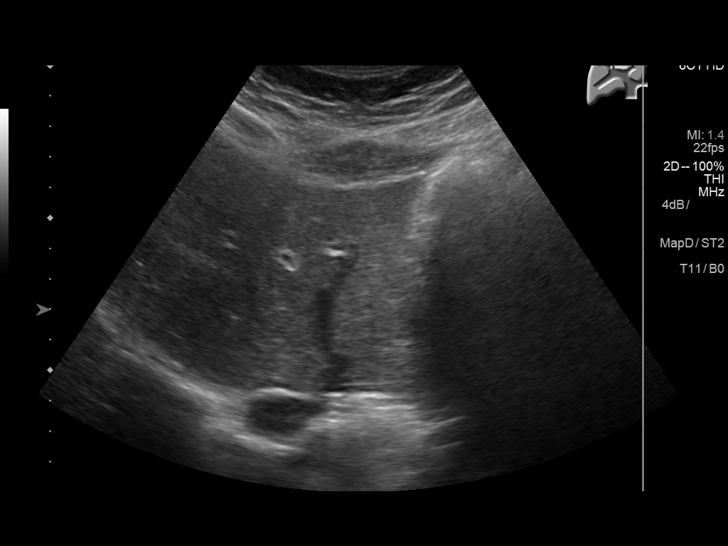
[im 45/72]
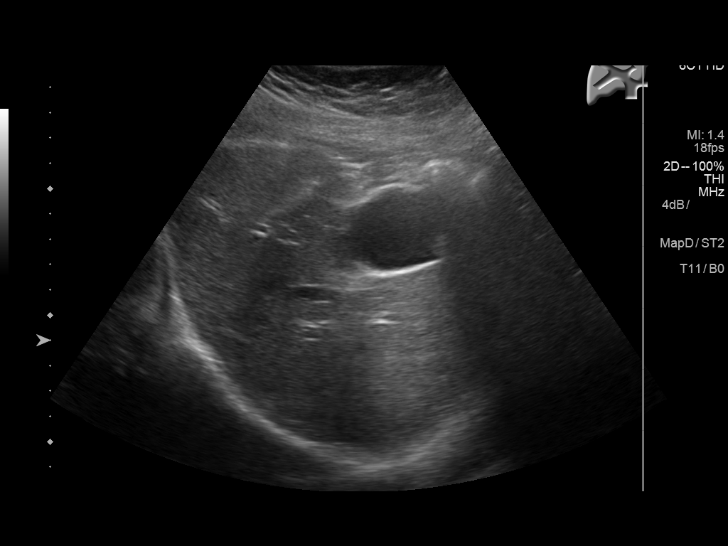
[im 48/72]
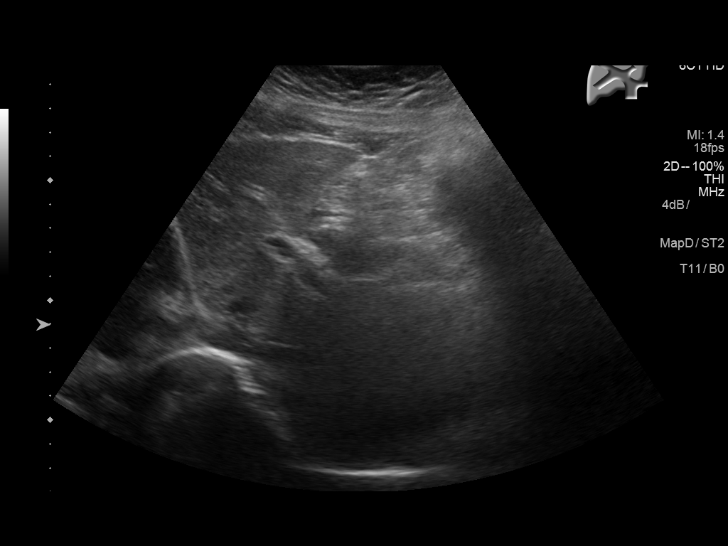
[im 54/72]
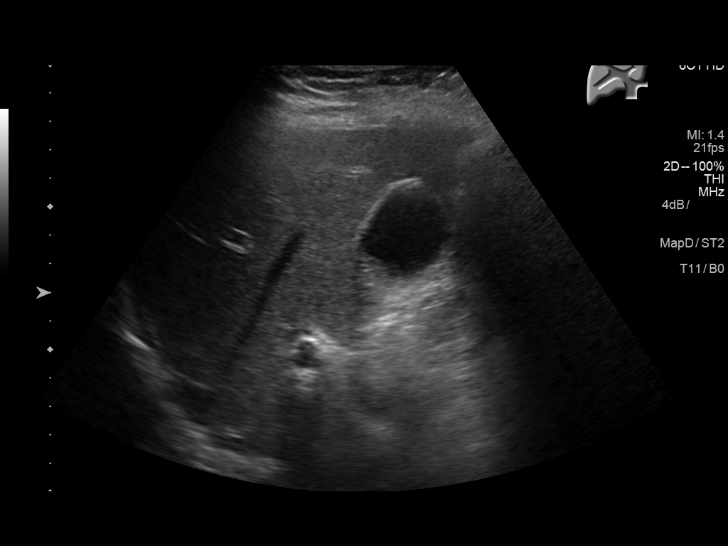
[im 60/72]
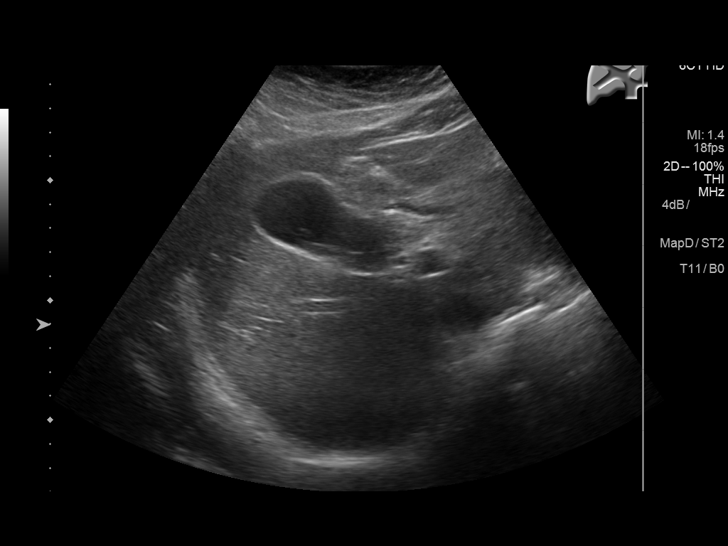
[im 66/72]
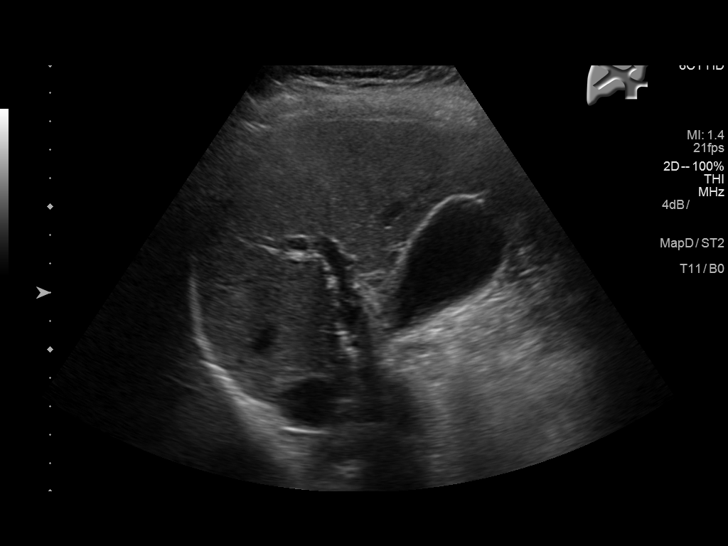
[im 72/72]
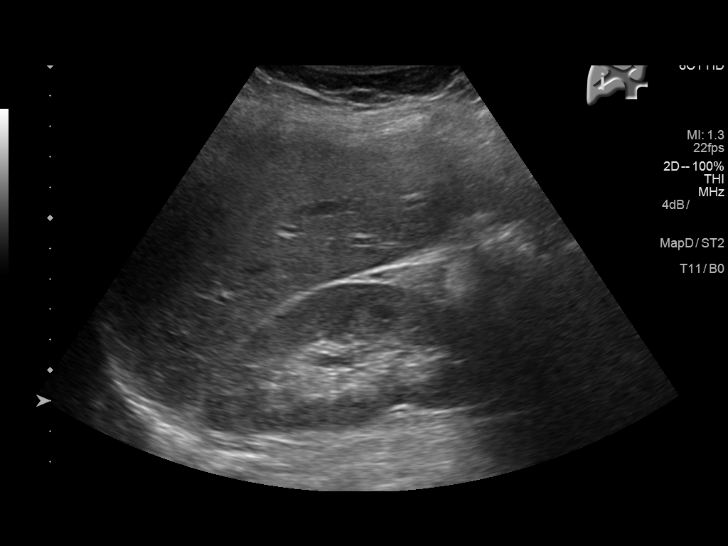

[14 of 25 positions shown; findings below may reference images not displayed]

FINDINGS: Gallbladder:

Multiple mobile echogenic gallstones measure up to 10 mm with
acoustic shadowing. No gallbladder wall thickening or
pericholecystic fluid. No sonographic Murphy's sign elicited.

Common bile duct:

Diameter: 4 mm

Liver:

No focal lesion identified. Within normal limits in parenchymal
echogenicity. Hepatopetal portal vein.
IMPRESSION: Cholelithiasis without sonographic findings of acute cholecystitis.

## 2017-12-19 IMAGING — US US OB TRANSVAGINAL
1 series · 13 of 28 positions shown · non-contrast
Comparison: None.

CLINICAL DATA: Abdominal pain and pregnancy for 2 weeks. Estimated
gestational age by LMP is 8 weeks 3 days. Quantitative beta HCG is
248,378

EXAM:
OBSTETRIC <14 WK US AND TRANSVAGINAL OB US
TECHNIQUE: Both transabdominal and transvaginal ultrasound examinations were
performed for complete evaluation of the gestation as well as the
maternal uterus, adnexal regions, and pelvic cul-de-sac.
Transvaginal technique was performed to assess early pregnancy.

[Series 1: us ob transvaginal · 0.13mm/px · 13 of 178 slices shown]
[im 7/178]
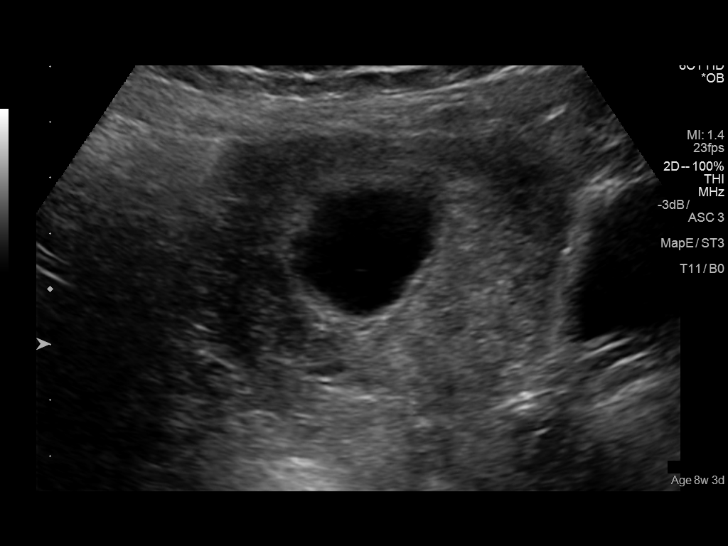
[im 20/178]
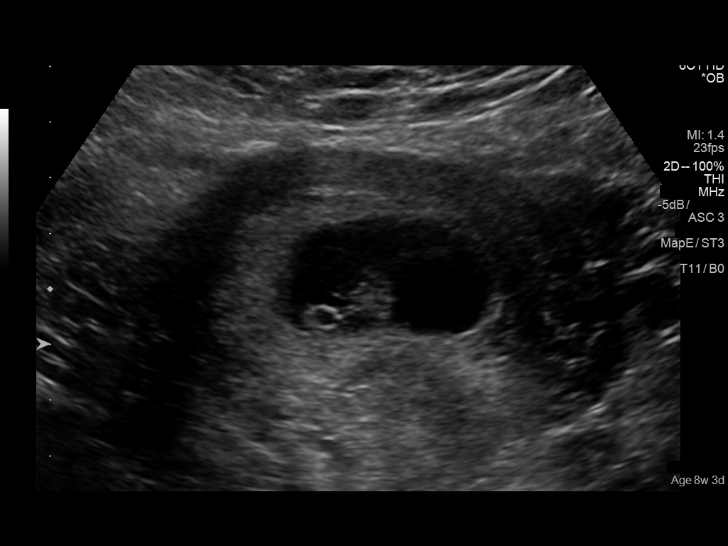
[im 33/178]
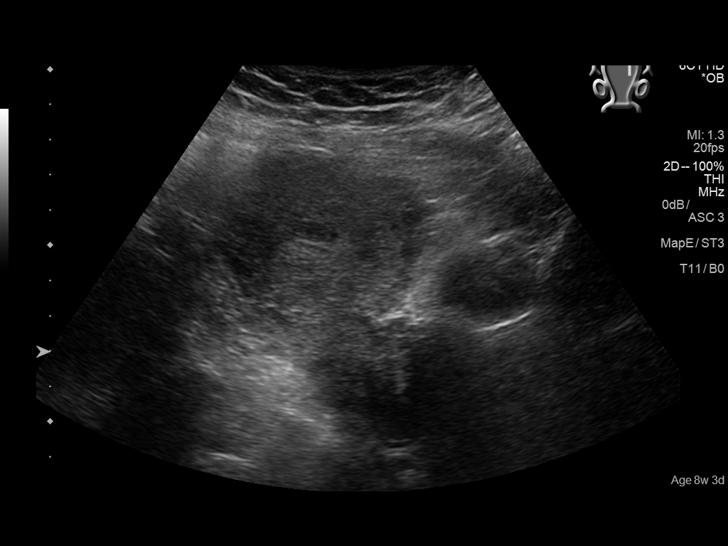
[im 46/178]
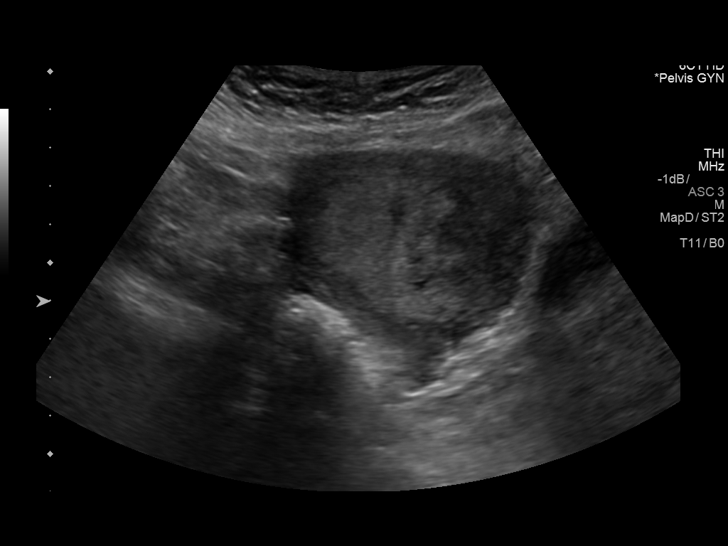
[im 60/178]
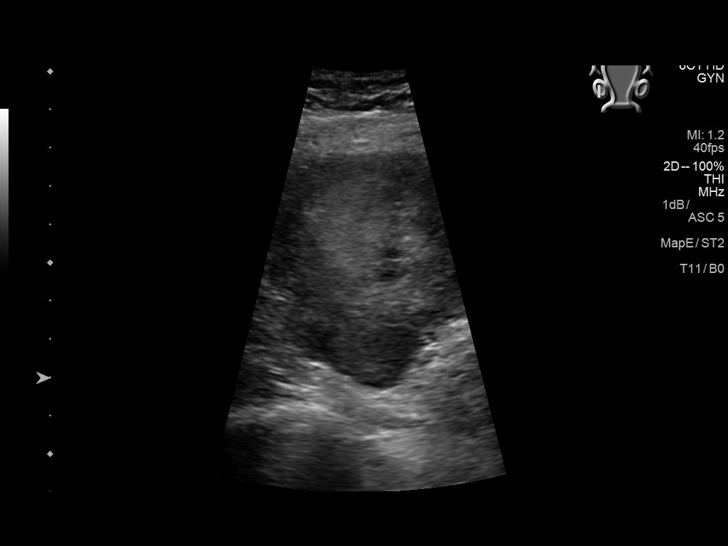
[im 73/178]
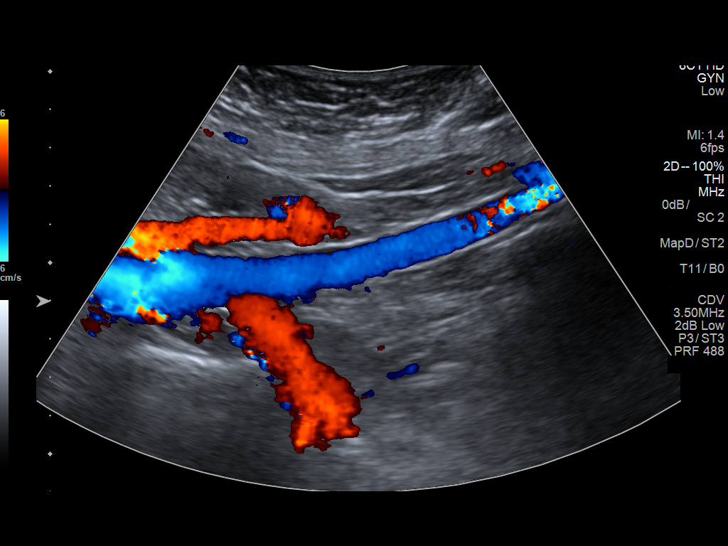
[im 92/178]
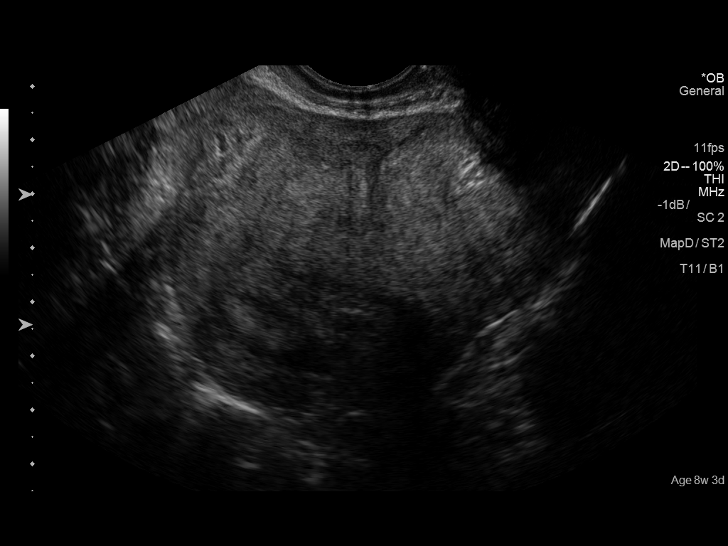
[im 105/178]
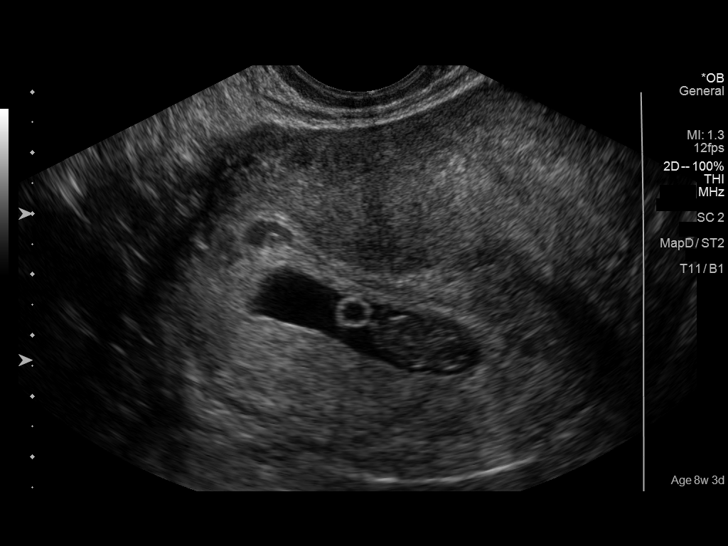
[im 119/178]
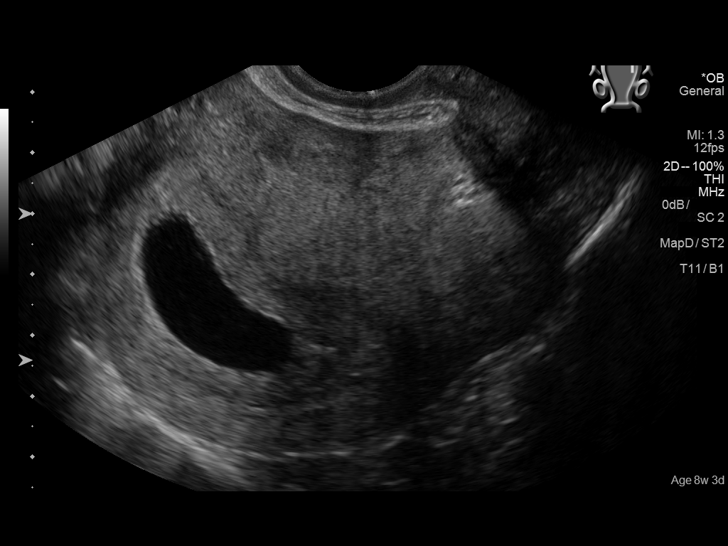
[im 132/178]
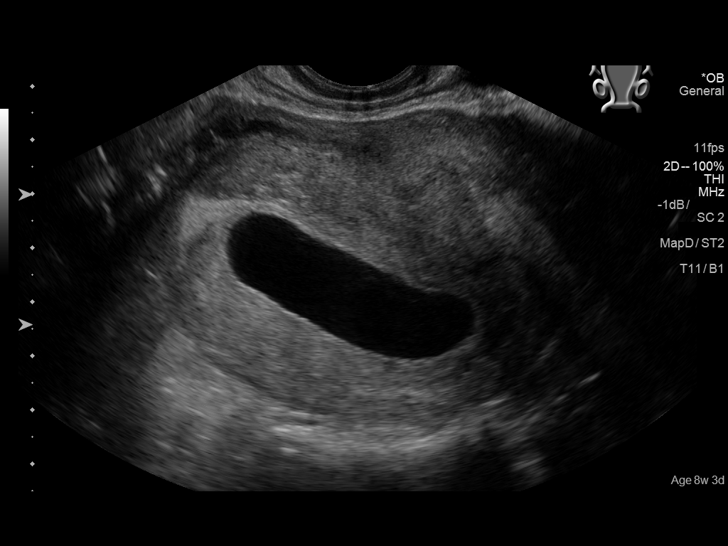
[im 145/178]
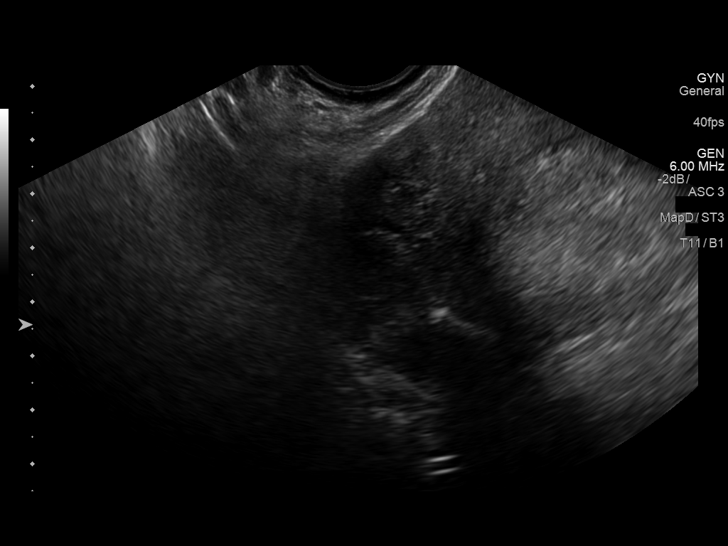
[im 158/178]
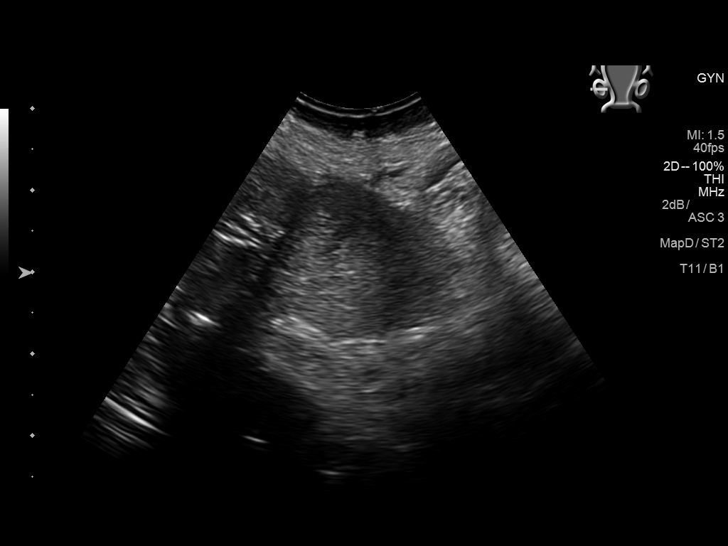
[im 171/178]
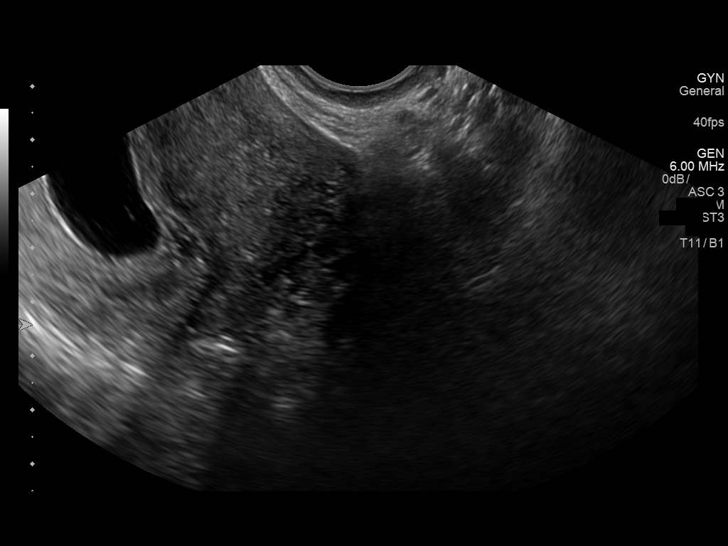

[13 of 28 positions shown; findings below may reference images not displayed]

FINDINGS: Intrauterine gestational sac: A single intrauterine gestational sac
is present.

Yolk sac:  Visualized.

Embryo:  Visualized.

Cardiac Activity: Visualized.

Heart Rate: 187  bpm

CRL:  18.9  mm   8 w   3 d                  US EDC: 10/09/2016

Subchorionic hemorrhage:  None visualized.

Maternal uterus/adnexae: Uterus is anteverted. No myometrial mass
lesions identified. The right ovary is visualized and appears
normal. Possible small corpus luteal cyst on the right. Left ovary
is not visualized due to overlying bowel. No adnexal masses seen. No
free fluid in the pelvis.
IMPRESSION: Single intrauterine pregnancy. Estimated gestational age by
crown-rump length is 8 weeks 3 days. No acute complication
demonstrated sonographically.

## 2018-07-23 ENCOUNTER — Telehealth: Payer: Self-pay | Admitting: *Deleted

## 2018-07-23 DIAGNOSIS — Z20822 Contact with and (suspected) exposure to covid-19: Secondary | ICD-10-CM

## 2018-07-23 NOTE — Telephone Encounter (Signed)
VM left for pt to CB for covid testing.  Assisted by interpreter Rodolph Bong # [phone]  Pt with cough, fever  Requested by [facility] Dept  Pts CB# [phone]

## 2018-07-23 NOTE — Telephone Encounter (Signed)
Pt scheduled for covid testing tomorrow at [facility] site.  Requested by [facility] Dept. Pt with symptoms.  Testing process reviewed, wear mask, stay in car; verbalizes understanding.  CB# [phone]

## 2018-07-24 ENCOUNTER — Other Ambulatory Visit: Payer: Medicaid Other

## 2018-07-24 DIAGNOSIS — Z20822 Contact with and (suspected) exposure to covid-19: Secondary | ICD-10-CM

## 2018-07-29 LAB — NOVEL CORONAVIRUS, NAA: SARS-CoV-2, NAA: NOT DETECTED

## 2018-08-06 ENCOUNTER — Telehealth: Payer: Self-pay | Admitting: General Practice

## 2018-08-06 NOTE — Telephone Encounter (Signed)
Pt notified by interpreter of negative results

## 2018-11-21 ENCOUNTER — Emergency Department: Payer: Self-pay

## 2018-11-21 ENCOUNTER — Encounter: Payer: Self-pay | Admitting: Emergency Medicine

## 2018-11-21 ENCOUNTER — Other Ambulatory Visit: Payer: Self-pay

## 2018-11-21 ENCOUNTER — Emergency Department
Admission: EM | Admit: 2018-11-21 | Discharge: 2018-11-21 | Disposition: A | Discharge status: Home / Self Care | Payer: Self-pay | Attending: Emergency Medicine | Admitting: Emergency Medicine

## 2018-11-21 DIAGNOSIS — K8050 Calculus of bile duct without cholangitis or cholecystitis without obstruction: Secondary | ICD-10-CM | POA: Insufficient documentation

## 2018-11-21 DIAGNOSIS — R1010 Upper abdominal pain, unspecified: Secondary | ICD-10-CM

## 2018-11-21 DIAGNOSIS — Z79899 Other long term (current) drug therapy: Secondary | ICD-10-CM | POA: Insufficient documentation

## 2018-11-21 DIAGNOSIS — D649 Anemia, unspecified: Secondary | ICD-10-CM | POA: Insufficient documentation

## 2018-11-21 LAB — CBC WITH DIFFERENTIAL/PLATELET
Abs Immature Granulocytes: 0.01 10*3/uL (ref 0.00–0.07)
Basophils Absolute: 0.1 10*3/uL (ref 0.0–0.1)
Basophils Relative: 1 %
Eosinophils Absolute: 0.1 10*3/uL (ref 0.0–0.5)
Eosinophils Relative: 2 %
HCT: 29.4 % — ABNORMAL LOW (ref 36.0–46.0)
Hemoglobin: 7.8 g/dL — ABNORMAL LOW (ref 12.0–15.0)
Immature Granulocytes: 0 %
Lymphocytes Relative: 25 %
Lymphs Abs: 1.2 10*3/uL (ref 0.7–4.0)
MCH: 17.8 pg — ABNORMAL LOW (ref 26.0–34.0)
MCHC: 26.5 g/dL — ABNORMAL LOW (ref 30.0–36.0)
MCV: 67.3 fL — ABNORMAL LOW (ref 80.0–100.0)
Monocytes Absolute: 0.4 10*3/uL (ref 0.1–1.0)
Monocytes Relative: 8 %
Neutro Abs: 3 10*3/uL (ref 1.7–7.7)
Neutrophils Relative %: 64 %
Platelets: 464 10*3/uL — ABNORMAL HIGH (ref 150–400)
RBC: 4.37 MIL/uL (ref 3.87–5.11)
RDW: 17.2 % — ABNORMAL HIGH (ref 11.5–15.5)
WBC: 4.8 10*3/uL (ref 4.0–10.5)
nRBC: 0 % (ref 0.0–0.2)

## 2018-11-21 LAB — COMPREHENSIVE METABOLIC PANEL
ALT: 18 U/L (ref 0–44)
AST: 26 U/L (ref 15–41)
Albumin: 4.1 g/dL (ref 3.5–5.0)
Alkaline Phosphatase: 64 U/L (ref 38–126)
Anion gap: 9 (ref 5–15)
BUN: 12 mg/dL (ref 6–20)
CO2: 22 mmol/L (ref 22–32)
Calcium: 9.2 mg/dL (ref 8.9–10.3)
Chloride: 109 mmol/L (ref 98–111)
Creatinine, Ser: 0.61 mg/dL (ref 0.44–1.00)
GFR calc Af Amer: >60 mL/min (ref >60)
GFR calc non Af Amer: >60 mL/min (ref >60)
Glucose, Bld: 93 mg/dL (ref 70–99)
Potassium: 3.9 mmol/L (ref 3.5–5.1)
Sodium: 140 mmol/L (ref 135–145)
Total Bilirubin: 0.5 mg/dL (ref 0.3–1.2)
Total Protein: 7.8 g/dL (ref 6.5–8.1)

## 2018-11-21 LAB — URINALYSIS, COMPLETE (UACMP) WITH MICROSCOPIC
Bacteria, UA: NONE SEEN
Bilirubin Urine: NEGATIVE
Glucose, UA: NEGATIVE mg/dL
Ketones, ur: NEGATIVE mg/dL
Leukocytes,Ua: NEGATIVE
Nitrite: NEGATIVE
Protein, ur: NEGATIVE mg/dL
Specific Gravity, Urine: 1.016 (ref 1.005–1.030)
pH: 6 (ref 5.0–8.0)

## 2018-11-21 LAB — LIPASE, BLOOD: Lipase: 47 U/L (ref 11–51)

## 2018-11-21 LAB — POCT PREGNANCY, URINE: Preg Test, Ur: NEGATIVE

## 2018-11-21 MED ORDER — FAMOTIDINE IN NACL 20-0.9 MG/50ML-% IV SOLN
20.0000 mg | Freq: Once | INTRAVENOUS | Status: AC
  Administered 2018-11-21: 05:00:00 20 mg via INTRAVENOUS
  Filled 2018-11-21: qty 50

## 2018-11-21 MED ORDER — ONDANSETRON HCL 4 MG/2ML IJ SOLN
4.0000 mg | Freq: Once | INTRAMUSCULAR | Status: AC
  Administered 2018-11-21: 05:00:00 4 mg via INTRAVENOUS
  Filled 2018-11-21: qty 2

## 2018-11-21 MED ORDER — ONDANSETRON 4 MG PO TBDP: 4.0000 mg | ORAL_TABLET | Freq: Three times a day (TID) | ORAL | 0 refills | Status: DC | PRN

## 2018-11-21 MED ORDER — FENTANYL CITRATE (PF) 100 MCG/2ML IJ SOLN
25.0000 ug | Freq: Once | INTRAMUSCULAR | Status: AC
  Administered 2018-11-21: 05:00:00 25 ug via INTRAVENOUS
  Filled 2018-11-21: qty 2

## 2018-11-21 MED ORDER — SODIUM CHLORIDE 0.9 % IV BOLUS
1000.0000 mL | Freq: Once | INTRAVENOUS | Status: AC
  Administered 2018-11-21: 1000 mL via INTRAVENOUS

## 2018-11-21 MED ORDER — OXYCODONE-ACETAMINOPHEN 5-325 MG PO TABS: 1.0000 | ORAL_TABLET | ORAL | 0 refills | Status: DC | PRN

## 2018-11-21 NOTE — ED Provider Notes (Addendum)
[facility] Emergency Department Provider Note   ____________________________________________   First MD Initiated Contact with Patient 11/21/18 0335     (approximate)  I have reviewed the triage vital signs and the nursing notes.   HISTORY  Chief Complaint Abdominal Pain  History obtained via [employer] Spanish interpreter  HPI Savannah Terry is a 28 y.o. female who presents to the ED from home with a chief complaint of abdominal pain.  Patient reports onset of right upper quadrant abdominal pain around 11 PM, awakening her from sleep.  Describes burning and sharp pain radiating into her back.  Similar symptoms 1 week ago.  She was told by her PCP that her iron tablets were causing the pain.  Patient takes iron tablets for anemia.  Denies fever, cough, chest pain, shortness of breath, nausea, vomiting or diarrhea. Denies recent travel or trauma.       Past Medical History:  Diagnosis Date  . Anemia     Patient Active Problem List   Diagnosis Date Noted  . Indication for care in labor or delivery 09/26/2016  . Lumbago 06/10/2016    Past Surgical History:  Procedure Laterality Date  . CESAREAN SECTION     x2    Prior to Admission medications   Medication Sig Start Date End Date Taking? Authorizing Provider  alum & mag hydroxide-simeth (MAALOX MAX) 400-400-40 MG/5ML suspension Take 5 mLs by mouth every 6 (six) hours as needed for indigestion. Patient not taking: Reported on 09/26/2016 03/02/16   Rudene Re, MD  doxylamine, Sleep, (UNISOM) 25 MG tablet Take 25 mg by mouth 2 (two) times daily.    [provider]  famotidine (PEPCID) 20 MG tablet Take 1 tablet (20 mg total) by mouth daily. Patient not taking: Reported on 09/26/2016 03/02/16 03/02/17  Rudene Re, MD  metoCLOPramide (REGLAN) 10 MG tablet Take 1 tablet (10 mg total) by mouth 3 (three) times daily with meals. Patient not taking: Reported on 09/26/2016 03/02/16  03/02/17  Rudene Re, MD  nitrofurantoin, macrocrystal-monohydrate, (MACROBID) 100 MG capsule Take 1 capsule (100 mg total) by mouth 2 (two) times daily. Patient not taking: Reported on 09/26/2016 04/02/16   Earleen Newport, MD  ondansetron (ZOFRAN ODT) 4 MG disintegrating tablet Take 1 tablet (4 mg total) by mouth every 8 (eight) hours as needed for nausea or vomiting. 11/21/18   Paulette Blanch, MD  oxyCODONE-acetaminophen (PERCOCET/ROXICET) 5-325 MG tablet Take 1 tablet by mouth every 4 (four) hours as needed for severe pain. 11/21/18   Paulette Blanch, MD  Prenatal Vit-Fe Fumarate-FA (PRENATAL PO) Take 1 tablet by mouth daily.    [provider]    Allergies Patient has no known allergies.  No family history on file.  Social History Social History   Tobacco Use  . Smoking status: Never Smoker  . Smokeless tobacco: Never Used  Substance Use Topics  . Alcohol use: No  . Drug use: No    Review of Systems  Constitutional: No fever/chills Eyes: No visual changes. ENT: No sore throat. Cardiovascular: Denies chest pain. Respiratory: Denies shortness of breath. Gastrointestinal: Positive for abdominal pain.  No nausea, no vomiting.  No diarrhea.  No constipation. Genitourinary: Negative for dysuria. Musculoskeletal: Negative for back pain. Skin: Negative for rash. Neurological: Negative for headaches, focal weakness or numbness.   ____________________________________________   PHYSICAL EXAM:  VITAL SIGNS: ED Triage Vitals  Enc Vitals Group     BP 11/21/18 0323 128/69  Pulse Rate 11/21/18 0323 80     Resp 11/21/18 0323 18     Temp 11/21/18 0323 98 F (36.7 C)     Temp Source 11/21/18 0323 Oral     SpO2 11/21/18 0323 99 %     Weight 11/21/18 0325 148 lb (67.1 kg)     Height --      Head Circumference --      Peak Flow --      Pain Score 11/21/18 0325 9     Pain Loc --      Pain Edu? --      Excl. in GC? --     Constitutional: Alert and oriented.  Well appearing and in mild acute distress. Eyes: Conjunctivae are normal. PERRL. EOMI. Head: Atraumatic. Nose: No congestion/rhinnorhea. Mouth/Throat: Mucous membranes are moist.  Oropharynx non-erythematous. Neck: No stridor.   Cardiovascular: Normal rate, regular rhythm. Grossly normal heart sounds.  Good peripheral circulation. Respiratory: Normal respiratory effort.  No retractions. Lungs CTAB. Gastrointestinal: Soft and mildly tender to palpation right upper quadrant without rebound or guarding. No distention. No abdominal bruits. No CVA tenderness. Musculoskeletal: No lower extremity tenderness nor edema.  No joint effusions. Neurologic:  Normal speech and language. No gross focal neurologic deficits are appreciated. No gait instability. Skin:  Skin is warm, dry and intact. No rash noted. Psychiatric: Mood and affect are normal. Speech and behavior are normal.  ____________________________________________   LABS (all labs ordered are listed, but only abnormal results are displayed)  Labs Reviewed  CBC WITH DIFFERENTIAL/PLATELET - Abnormal; Notable for the following components:      Result Value   Hemoglobin 7.8 (*)    HCT 29.4 (*)    MCV 67.3 (*)    MCH 17.8 (*)    MCHC 26.5 (*)    RDW 17.2 (*)    Platelets 464 (*)    All other components within normal limits  URINALYSIS, COMPLETE (UACMP) WITH MICROSCOPIC - Abnormal; Notable for the following components:   Color, Urine YELLOW (*)    APPearance CLEAR (*)    Hgb urine dipstick MODERATE (*)    All other components within normal limits  COMPREHENSIVE METABOLIC PANEL  LIPASE, BLOOD  POC URINE PREG, ED  POCT PREGNANCY, URINE   ____________________________________________  EKG  ED ECG REPORT I, Navie Lamoreaux J, the attending physician, personally viewed and interpreted this ECG.   Date: 11/21/2018  EKG Time: 0324  Rate: 83  Rhythm: normal EKG, normal sinus rhythm  Axis: Normal  Intervals:none  ST&T Change: Nonspecific   ____________________________________________  RADIOLOGY  ED MD interpretation: Cholelithiasis, mild wall thickening  Official radiology report(s): Koreas Abdomen Limited Ruq  Result Date: 11/21/2018 CLINICAL DATA:  Right upper quadrant pain EXAM: ULTRASOUND ABDOMEN LIMITED RIGHT UPPER QUADRANT COMPARISON:  03/03/2016 FINDINGS: Gallbladder: Gallstones including 10 mm stone in the neck. Mild gallbladder wall thickening and gallbladder tenderness without gallbladder or pericholecystic edema. Ring down artifact seen from the anterior wall consistent with adenomyomatosis. Common bile duct: Diameter: 3 mm.  Where visualized, no filling defect. Liver: No focal lesion identified. Within normal limits in parenchymal echogenicity. Portal vein is patent on color Doppler imaging with normal direction of blood flow towards the liver. IMPRESSION: 1. Cholelithiasis including stone fixed at the neck. 2. Mild gallbladder wall thickening and focal tenderness, cannot exclude early acute cholecystitis. Electronically Signed   By: Marnee SpringJonathon  Watts M.D.   On: 11/21/2018 05:38    ____________________________________________   PROCEDURES  Procedure(s) performed (including Critical Care):  Procedures   ____________________________________________   INITIAL IMPRESSION / ASSESSMENT AND PLAN / ED COURSE  As part of my medical decision making, I reviewed the following data within the electronic MEDICAL RECORD NUMBER Nursing notes reviewed and incorporated, Interpreter needed, Labs reviewed, Old chart reviewed, Radiograph reviewed, Notes from prior ED visits and [place] Controlled Substance Database     Seanna Sisler was evaluated in Emergency Department on 11/21/2018 for the symptoms described in the history of present illness. She was evaluated in the context of the global COVID-19 pandemic, which necessitated consideration that the patient might be at risk for infection with the SARS-CoV-2 virus that causes  COVID-19. Institutional protocols and algorithms that pertain to the evaluation of patients at risk for COVID-19 are in a state of rapid change based on information released by regulatory bodies including the CDC and federal and state organizations. These policies and algorithms were followed during the patient's care in the ED.    28 year old female who presents with upper abdominal pain. Differential diagnosis includes, but is not limited to, biliary disease (biliary colic, acute cholecystitis, cholangitis, choledocholithiasis, etc), intrathoracic causes for epigastric abdominal pain including ACS, gastritis, duodenitis, pancreatitis, small bowel or large bowel obstruction, abdominal aortic aneurysm, hernia, and ulcer(s).  Will obtain basic lab work to include LFTs and lipase, proceed with right upper quadrant abdominal ultrasound to evaluate for cholecystitis.  Initiate IV fluid resuscitation, IV fentanyl for pain paired with IV Zofran for nausea.  IV Pepcid for GERD symptoms.   Clinical Course as of Nov 21 623  Wed Nov 21, 2018  7829 Patient is pain-free.  Updated her on all test results.  She is still taking daily iron tablets for anemia.  Unlikely cholecystitis given lack of fever, white count and pain is well controlled.  There is no Murphy sign on reexamination.  Will discharge home with prescriptions for Percocet and Zofran, strongly encouraged bland diet and close surgery follow-up.  Strict return precautions given.  Patient and family member verbalized understanding agree with plan of care.   [JS]    Clinical Course User Index [JS] Irean Hong, MD     ____________________________________________   FINAL CLINICAL IMPRESSION(S) / ED DIAGNOSES  Final diagnoses:  Pain of upper abdomen  Anemia, unspecified type  Biliary colic     ED Discharge Orders         Ordered    oxyCODONE-acetaminophen (PERCOCET/ROXICET) 5-325 MG tablet  Every 4 hours PRN     11/21/18 0559     ondansetron (ZOFRAN ODT) 4 MG disintegrating tablet  Every 8 hours PRN     11/21/18 0559           Note:  This document was prepared using Dragon voice recognition software and may include unintentional dictation errors.   Irean Hong, MD 11/21/18 5621    Irean Hong, MD 12/07/18 [phone]

## 2018-11-21 NOTE — Discharge Instructions (Signed)
1. Take medicines as needed for pain & nausea (Percocet/Zofran #30). 2. Clear liquids x 12 hours, then bland diet x 1 week, then slowly advance diet as tolerated. Avoid fatty, greasy, spicy foods and drinks. 3. Return to the ER for worsening symptoms, persistent vomiting, fever, difficulty breathing or other concerns.  

## 2018-11-21 NOTE — ED Triage Notes (Signed)
Patient ambulatory to triage with steady gait, without difficulty or distress noted, mask in place; per [employer] video interpreter pt reports rt upper abd pain radiating into back intermittently last month with no accomp symptoms; st pain unrelieved by omeprazole

## 2019-10-17 ENCOUNTER — Emergency Department
Admission: EM | Admit: 2019-10-17 | Discharge: 2019-10-17 | Disposition: A | Discharge status: Home / Self Care | Payer: Self-pay | Attending: Emergency Medicine | Admitting: Emergency Medicine

## 2019-10-17 ENCOUNTER — Other Ambulatory Visit: Payer: Self-pay

## 2019-10-17 DIAGNOSIS — G43019 Migraine without aura, intractable, without status migrainosus: Secondary | ICD-10-CM | POA: Insufficient documentation

## 2019-10-17 MED ORDER — DIPHENHYDRAMINE HCL 50 MG/ML IJ SOLN
50.0000 mg | Freq: Once | INTRAMUSCULAR | Status: AC
  Administered 2019-10-17: 17:00:00 50 mg via INTRAMUSCULAR
  Filled 2019-10-17: qty 1

## 2019-10-17 MED ORDER — PROMETHAZINE HCL 25 MG/ML IJ SOLN
25.0000 mg | Freq: Once | INTRAMUSCULAR | Status: AC
  Administered 2019-10-17: 17:00:00 25 mg via INTRAMUSCULAR

## 2019-10-17 MED ORDER — KETOROLAC TROMETHAMINE 30 MG/ML IJ SOLN
30.0000 mg | Freq: Once | INTRAMUSCULAR | Status: AC
  Administered 2019-10-17: 17:00:00 30 mg via INTRAMUSCULAR
  Filled 2019-10-17: qty 1

## 2019-10-17 MED ORDER — BUTALBITAL-APAP-CAFFEINE 50-325-40 MG PO TABS: 1.0000 | ORAL_TABLET | Freq: Four times a day (QID) | ORAL | 0 refills | Status: AC | PRN

## 2019-10-17 NOTE — ED Triage Notes (Signed)
Interpreter Maryjane Hurter used for triage. Pt reports headache X 1 week, states that she has hx of headaches. Has tried tylenol at home with no relief. Pt alert and oriented X4, cooperative, RR even and unlabored, color WNL. Pt in NAD.

## 2019-10-17 NOTE — ED Notes (Signed)
Interpreter request placed. EDP aware

## 2019-10-17 NOTE — ED Notes (Signed)
Pt with headache x 5 days.

## 2019-10-17 NOTE — ED Provider Notes (Signed)
[facility] Emergency Department Provider Note  ____________________________________________  Time seen: Approximately 4:11 PM  I have reviewed the triage vital signs and the nursing notes.   HISTORY  Chief Complaint Headache  Interpreter was used  HPI Savannah Terry is a 29 y.o. female who presents to the ED with a complaint of R side headache.  Patient states that she has a history of migraines.  Typically it resolves with Tylenol.  Patient states that she has had a migraine to the right side for 7 days.  Typically her migraines last 2 to 4 days.  No atypical symptoms of numbness or tingling, weakness.  No recent head trauma.  No fevers or chills.  No other complaints other than right-sided migraine that does not resolve with Tylenol.  Patient has never seen neurology for her headaches.  She does not take any prescription medications for migraines.        Past Medical History:  Diagnosis Date  . Anemia     Patient Active Problem List   Diagnosis Date Noted  . Indication for care in labor or delivery 09/26/2016  . Lumbago 06/10/2016    Past Surgical History:  Procedure Laterality Date  . CESAREAN SECTION     x2    Prior to Admission medications   Medication Sig Start Date End Date Taking? Authorizing Provider  butalbital-acetaminophen-caffeine (FIORICET) 50-325-40 MG tablet Take 1-2 tablets by mouth every 6 (six) hours as needed for headache. 10/17/19 10/16/20  Milt Coye, Delorise Royals, PA-C  doxylamine, Sleep, (UNISOM) 25 MG tablet Take 25 mg by mouth 2 (two) times daily.    [provider]    Allergies Patient has no known allergies.  No family history on file.  Social History Social History   Tobacco Use  . Smoking status: Never Smoker  . Smokeless tobacco: Never Used  Substance Use Topics  . Alcohol use: No  . Drug use: No     Review of Systems  Constitutional: No fever/chills Eyes: No visual changes. No  discharge ENT: No upper respiratory complaints. Cardiovascular: no chest pain. Respiratory: no cough. No SOB. Gastrointestinal: No abdominal pain.  No nausea, no vomiting.  No diarrhea.  No constipation. Musculoskeletal: Negative for musculoskeletal pain. Skin: Negative for rash, abrasions, lacerations, ecchymosis. Neurological: R sided migraine without focal weakness or numbness. 10-point ROS otherwise negative.  ____________________________________________   PHYSICAL EXAM:  VITAL SIGNS: ED Triage Vitals [10/17/19 1537]  Enc Vitals Group     BP 107/75     Pulse Rate 69     Resp 16     Temp 98 F (36.7 C)     Temp Source Oral     SpO2 100 %     Weight 180 lb (81.6 kg)     Height 5' (1.524 m)     Head Circumference      Peak Flow      Pain Score 9     Pain Loc      Pain Edu?      Excl. in GC?      Constitutional: Alert and oriented. Well appearing and in no acute distress. Eyes: Conjunctivae are normal. PERRL. EOMI. Head: Atraumatic. ENT:      Ears:       Nose: No congestion/rhinnorhea.      Mouth/Throat: Mucous membranes are moist.  Neck: No stridor.  No cervical spine tenderness to palpation. Hematological/Lymphatic/Immunilogical: No cervical lymphadenopathy. Cardiovascular: Normal rate, regular rhythm. Normal S1 and S2.  Good  peripheral circulation. Respiratory: Normal respiratory effort without tachypnea or retractions. Lungs CTAB. Good air entry to the bases with no decreased or absent breath sounds. Musculoskeletal: Full range of motion to all extremities. No gross deformities appreciated. Neurologic:  Normal speech and language. No gross focal neurologic deficits are appreciated.  Cranial nerves II through XII grossly intact.  Negative Romberg's and pronator drift. Skin:  Skin is warm, dry and intact. No rash noted. Psychiatric: Mood and affect are normal. Speech and behavior are normal. Patient exhibits appropriate insight and  judgement.   ____________________________________________   LABS (all labs ordered are listed, but only abnormal results are displayed)  Labs Reviewed - No data to display ____________________________________________  EKG   ____________________________________________  RADIOLOGY   No results found.  ____________________________________________    PROCEDURES  Procedure(s) performed:    Procedures    Medications  ketorolac (TORADOL) 30 MG/ML injection 30 mg (has no administration in time range)  promethazine (PHENERGAN) injection 25 mg (has no administration in time range)  diphenhydrAMINE (BENADRYL) injection 50 mg (has no administration in time range)     ____________________________________________   INITIAL IMPRESSION / ASSESSMENT AND PLAN / ED COURSE  Pertinent labs & imaging results that were available during my care of the patient were reviewed by me and considered in my medical decision making (see chart for details).  Review of the [place] CSRS was performed in accordance of the [facility] prior to dispensing any controlled drugs.           Patient's diagnosis is consistent with migraine.  Patient presented to emergency department complaining of right-sided migraine.  Patient states that the symptoms are not atypical other than length and the fact that it would not resolve with Tylenol.  Exam is reassuring.  No other complaints.  No indication for labs or imaging.  Patient will be treated with migraine cocktail.  Patient will have a small prescription for Fioricet to be taken if headache returns..  I have advised patient to follow-up with neurology given her chronic migraine status.  Patient is given ED precautions to return to the ED for any worsening or new symptoms.     ____________________________________________  FINAL CLINICAL IMPRESSION(S) / ED DIAGNOSES  Final diagnoses:  Intractable migraine without aura and without status migrainosus       NEW MEDICATIONS STARTED DURING THIS VISIT:  ED Discharge Orders         Ordered    butalbital-acetaminophen-caffeine (FIORICET) 50-325-40 MG tablet  Every 6 hours PRN        10/17/19 1639              This chart was dictated using voice recognition software/Dragon. Despite best efforts to proofread, errors can occur which can change the meaning. Any change was purely unintentional.    Racheal Patches, PA-C 10/17/19 1640    Phineas Semen, MD 10/17/19 1723

## 2023-06-05 ENCOUNTER — Other Ambulatory Visit: Payer: Self-pay

## 2023-06-05 ENCOUNTER — Emergency Department
Admission: EM | Admit: 2023-06-05 | Discharge: 2023-06-05 | Disposition: A | Discharge status: Home / Self Care | Payer: Self-pay | Attending: Emergency Medicine | Admitting: Emergency Medicine

## 2023-06-05 DIAGNOSIS — N939 Abnormal uterine and vaginal bleeding, unspecified: Secondary | ICD-10-CM | POA: Insufficient documentation

## 2023-06-05 LAB — BASIC METABOLIC PANEL WITH GFR
Anion gap: 7 (ref 5–15)
BUN: 12 mg/dL (ref 6–20)
CO2: 21 mmol/L — ABNORMAL LOW (ref 22–32)
Calcium: 8.4 mg/dL — ABNORMAL LOW (ref 8.9–10.3)
Chloride: 107 mmol/L (ref 98–111)
Creatinine, Ser: 0.62 mg/dL (ref 0.44–1.00)
GFR, Estimated: >60 mL/min (ref >60)
Glucose, Bld: 87 mg/dL (ref 70–99)
Potassium: 3.7 mmol/L (ref 3.5–5.1)
Sodium: 135 mmol/L (ref 135–145)

## 2023-06-05 LAB — CBC
HCT: 38.4 % (ref 36.0–46.0)
Hemoglobin: 12.2 g/dL (ref 12.0–15.0)
MCH: 27.5 pg (ref 26.0–34.0)
MCHC: 31.8 g/dL (ref 30.0–36.0)
MCV: 86.5 fL (ref 80.0–100.0)
Platelets: 366 10*3/uL (ref 150–400)
RBC: 4.44 MIL/uL (ref 3.87–5.11)
RDW: 16.7 % — ABNORMAL HIGH (ref 11.5–15.5)
WBC: 7.2 10*3/uL (ref 4.0–10.5)
nRBC: 0 % (ref 0.0–0.2)

## 2023-06-05 LAB — URINALYSIS, ROUTINE W REFLEX MICROSCOPIC
Bilirubin Urine: NEGATIVE
Glucose, UA: NEGATIVE mg/dL
Ketones, ur: NEGATIVE mg/dL
Leukocytes,Ua: NEGATIVE
Nitrite: NEGATIVE
Protein, ur: NEGATIVE mg/dL
RBC / HPF: >50 RBC/hpf (ref 0–5)
Specific Gravity, Urine: 1.015 (ref 1.005–1.030)
pH: 6 (ref 5.0–8.0)

## 2023-06-05 LAB — LIPASE, BLOOD: Lipase: 43 U/L (ref 11–51)

## 2023-06-05 LAB — POC URINE PREG, ED: Preg Test, Ur: NEGATIVE

## 2023-06-05 MED ORDER — NAPROXEN 500 MG PO TABS: 500.0000 mg | ORAL_TABLET | Freq: Two times a day (BID) | ORAL | 0 refills | Status: AC

## 2023-06-05 MED ORDER — KETOROLAC TROMETHAMINE 30 MG/ML IJ SOLN
30.0000 mg | Freq: Once | INTRAMUSCULAR | Status: AC
  Administered 2023-06-05: 30 mg via INTRAMUSCULAR
  Filled 2023-06-05: qty 1

## 2023-06-05 MED ORDER — BUTALBITAL-APAP-CAFFEINE 50-325-40 MG PO TABS
1.0000 | ORAL_TABLET | Freq: Four times a day (QID) | ORAL | Status: DC | PRN
  Administered 2023-06-05: 1 via ORAL
  Filled 2023-06-05: qty 1

## 2023-06-05 NOTE — ED Triage Notes (Signed)
 Pt to ED via POV from home. Pt reports vaginal bleeding, HA and abd pain x3 days. Pt reports still having menstruation. Pt also reports blood clots. Pt denies blood thinner.

## 2023-06-05 NOTE — ED Provider Notes (Signed)
 [facility] Emergency Department Provider Note     Event Date/Time   First MD Initiated Contact with Patient 06/05/23 1847     (approximate)   History   Vaginal Bleeding   HPI  Savannah Terry is a 33 y.o. female with a past medical history of anemia presents to the ED for evaluation of heavy vaginal bleeding x 3 days. Patient is currently on her menstrual cycle that began Friday night.  Associated symptoms include lower abdominal cramping and headache.  Patient has not taken anything for pain.  History of bilateral tubal ligation.  Denies pregnancy, fever and nausea/vomiting.    Physical Exam   Triage Vital Signs: ED Triage Vitals [06/05/23 1708]  Encounter Vitals Group     BP (!) 140/95     Systolic BP Percentile      Diastolic BP Percentile      Pulse Rate 75     Resp 20     Temp 98.1 F (36.7 C)     Temp Source Oral     SpO2 98 %     Weight 145 lb (65.8 kg)     Height      Head Circumference      Peak Flow      Pain Score 8     Pain Loc      Pain Education      Exclude from Growth Chart     Most recent vital signs: Vitals:   06/05/23 1708  BP: (!) 140/95  Pulse: 75  Resp: 20  Temp: 98.1 F (36.7 C)  SpO2: 98%    General: Alert and oriented. INAD.  Skin:  Warm, dry and intact. No rashes or lesions noted.     Head:  NCAT.  Eyes:  PERRLA. EOMI.  CV:  Good peripheral perfusion.  RESP:  Normal effort.  ABD:  No distention. Soft, Non tender.  Mild bilateral lower pelvic tenderness upon palpation. NEURO: Cranial nerves intact. No focal deficits. Sensation and motor function intact. 5/5 muscle strength of UE & LE. Gait is steady.  OTHER:  Pelvic exam performed.  Normal external genitalia.  Moderate bleeding noted in vaginal vault and from cervical os otherwise normal appearing cervix.     ED Results / Procedures / Treatments   Labs (all labs ordered are listed, but only abnormal results are displayed) Labs Reviewed   CBC - Abnormal; Notable for the following components:      Result Value   RDW 16.7 (*)    All other components within normal limits  BASIC METABOLIC PANEL WITH GFR - Abnormal; Notable for the following components:   CO2 21 (*)    Calcium 8.4 (*)    All other components within normal limits  URINALYSIS, ROUTINE W REFLEX MICROSCOPIC - Abnormal; Notable for the following components:   Color, Urine YELLOW (*)    APPearance CLEAR (*)    Hgb urine dipstick LARGE (*)    Bacteria, UA RARE (*)    All other components within normal limits  LIPASE, BLOOD  POC URINE PREG, ED   No results found.  PROCEDURES:  Critical Care performed: No  Procedures  MEDICATIONS ORDERED IN ED: Medications  butalbital -acetaminophen -caffeine  (FIORICET ) 50-325-40 MG per tablet 1 tablet (1 tablet Oral Given 06/05/23 1942)  ketorolac  (TORADOL ) 30 MG/ML injection 30 mg (30 mg Intramuscular Given 06/05/23 1947)   IMPRESSION / MDM / ASSESSMENT AND PLAN / ED COURSE  I reviewed the triage vital signs  and the nursing notes.                               33 y.o. female presents to the emergency department for evaluation and treatment of vaginal bleeding. See HPI for further details.   Differential diagnosis includes, but is not limited to vaginal/cervical trauma, menorrhagia, ectopic pregnancy considered but unlikely   Patient's presentation is most consistent with acute complicated illness / injury requiring diagnostic workup.  Patient is alert and oriented.  She is hemodynamically stable and afebrile.  Labs are reassuring.  Normal hemoglobin.  Pelvic exam as stated above is pertinent for bleeding in the vaginal vault however with patient currently on menstrual cycle I do believe this is the contributing factor.  IM Toradol  given for cramping pain.  I have sent naproxen  to her pharmacy to help decrease intrauterine bleeding and advised a close follow-up with OB/GYN for further managment.  Patient is stable at this  time.  ED return precautions discussed.   FINAL CLINICAL IMPRESSION(S) / ED DIAGNOSES   Final diagnoses:  Abnormal uterine bleeding (AUB)     Rx / DC Orders   ED Discharge Orders          Ordered    naproxen  (NAPROSYN ) 500 MG tablet  2 times daily with meals        06/05/23 2021           Note:  This document was prepared using Dragon voice recognition software and may include unintentional dictation errors.    Phyllis Breeze, Alim Cattell A, PA-C 06/05/23 2031    Kandee Orion, MD 06/05/23 2203

## 2023-06-05 NOTE — Discharge Instructions (Addendum)
 You were evaluated in the ED for heavy menstrual bleeding.  Your lab work is reassuring.  Your physical exam is consistent with menstrual bleeding.  Please follow-up with OB/GYN for further management. Call and schedule and appointment in 1 week with Dr. Cleora Daft, OBGYN.  Get plenty of rest and stay hydrated.
